# Patient Record
Sex: Female | Born: 2000 | Race: Black or African American | Hispanic: No | Marital: Single | Smoking: Current every day smoker
Health system: Southern US, Community
[De-identification: ages and names within clinical notes are randomized; demographics above are authoritative.]

## PROBLEM LIST (undated history)

## (undated) ENCOUNTER — Emergency Department: Source: Home / Self Care

## (undated) DIAGNOSIS — K859 Acute pancreatitis without necrosis or infection, unspecified: Secondary | ICD-10-CM

## (undated) DIAGNOSIS — E119 Type 2 diabetes mellitus without complications: Secondary | ICD-10-CM

## (undated) HISTORY — PX: OTHER SURGICAL HISTORY: SHX169

---
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
## 2019-05-07 ENCOUNTER — Other Ambulatory Visit: Payer: Self-pay

## 2019-05-07 ENCOUNTER — Encounter: Payer: Self-pay | Admitting: Advanced Practice Midwife

## 2019-05-07 ENCOUNTER — Ambulatory Visit: Payer: Medicaid Other | Admitting: Advanced Practice Midwife

## 2019-05-07 DIAGNOSIS — Z113 Encounter for screening for infections with a predominantly sexual mode of transmission: Secondary | ICD-10-CM | POA: Diagnosis not present

## 2019-05-07 DIAGNOSIS — E1043 Type 1 diabetes mellitus with diabetic autonomic (poly)neuropathy: Secondary | ICD-10-CM | POA: Insufficient documentation

## 2019-05-07 DIAGNOSIS — IMO0002 Reserved for concepts with insufficient information to code with codable children: Secondary | ICD-10-CM | POA: Insufficient documentation

## 2019-05-07 LAB — WET PREP FOR TRICH, YEAST, CLUE
Trichomonas Exam: NEGATIVE
Yeast Exam: NEGATIVE

## 2019-05-07 NOTE — Progress Notes (Signed)
Wet mount reviewed and is negative today, so no treatment needed for wet mount per standing order. Phone #'s provided to pt per provider order. Provider orders completed.Ronny Bacon, RN

## 2019-05-07 NOTE — Progress Notes (Signed)
    STI clinic/screening visit  Subjective:  Karen Johnson is a 18 y.o. female being seen today for an STI screening visit. The patient reports they do not have symptoms.  Patient has the following medical conditions:  There are no active problems to display for this patient.    No chief complaint on file.   HPI  Patient reports no symptoms.  Diabetic but not taking insulin or Metformin because can't get to [facility] where her doctor is  See flowsheet for further details and programmatic requirements.    The following portions of the patient's history were reviewed and updated as appropriate: allergies, current medications, past medical history, past social history, past surgical history and problem list.  Objective:  There were no vitals filed for this visit.  Physical Exam Vitals signs and nursing note reviewed.  Constitutional:      Appearance: Normal appearance.  HENT:     Head: Normocephalic and atraumatic.     Mouth/Throat:     Mouth: Mucous membranes are moist.     Pharynx: Oropharynx is clear. No oropharyngeal exudate or posterior oropharyngeal erythema.  Pulmonary:     Effort: Pulmonary effort is normal.  Abdominal:     General: Abdomen is flat.     Palpations: There is no mass.     Tenderness: There is no abdominal tenderness. There is no rebound.  Genitourinary:    General: Normal vulva.     Exam position: Lithotomy position.     Pubic Area: No rash or pubic lice.      Labia:        Right: No rash or lesion.        Left: No rash or lesion.      Vagina: Vaginal discharge (small amt white cottage cheese leukorrhea) and erythema (sl) present. No bleeding or lesions.     Cervix: No cervical motion tenderness, discharge, friability, lesion or erythema.     Uterus: Normal.      Adnexa: Right adnexa normal and left adnexa normal.     Rectum: Normal.  Lymphadenopathy:     Head:     Right side of head: No preauricular or posterior auricular adenopathy.     Left side  of head: No preauricular or posterior auricular adenopathy.     Cervical: No cervical adenopathy.     Upper Body:     Right upper body: No supraclavicular or axillary adenopathy.     Left upper body: No supraclavicular or axillary adenopathy.     Lower Body: No right inguinal adenopathy. No left inguinal adenopathy.  Skin:    General: Skin is warm and dry.     Findings: No rash.  Neurological:     Mental Status: She is alert and oriented to person, place, and time.       Assessment and Plan:  Karen Johnson is a 18 y.o. female presenting to the [facility] Department for STI screening  1. Screening examination for venereal disease Treat wet mount per standing orders Immunization nurse consult Please give [facility] # to pt - WET PREP FOR [place], YEAST, CLUE - Syphilis Serology, [place] [facility] - HIV [place] [facility] - Chlamydia/Gonorrhea [place] [facility]     No follow-ups on file.  No future appointments.  Herbie Saxon, CNM

## 2019-05-26 ENCOUNTER — Other Ambulatory Visit: Payer: Self-pay

## 2019-05-26 ENCOUNTER — Ambulatory Visit (LOCAL_COMMUNITY_HEALTH_CENTER): Payer: Medicaid Other

## 2019-05-26 VITALS — BP 120/73 | Ht 68.0 in | Wt 217.0 lb

## 2019-05-26 DIAGNOSIS — Z3201 Encounter for pregnancy test, result positive: Secondary | ICD-10-CM

## 2019-05-26 LAB — PREGNANCY, URINE: Preg Test, Ur: POSITIVE — AB

## 2019-05-26 NOTE — Progress Notes (Signed)
Pt unsure if she will continue with pregnancy, is unsure about any potential providers.

## 2019-05-27 ENCOUNTER — Emergency Department: Payer: BLUE CROSS/BLUE SHIELD

## 2019-05-27 ENCOUNTER — Emergency Department
Admission: EM | Admit: 2019-05-27 | Discharge: 2019-05-27 | Disposition: A | Discharge status: Home / Self Care | Payer: BLUE CROSS/BLUE SHIELD | Attending: Emergency Medicine | Admitting: Emergency Medicine

## 2019-05-27 ENCOUNTER — Other Ambulatory Visit: Payer: Self-pay

## 2019-05-27 DIAGNOSIS — Z3491 Encounter for supervision of normal pregnancy, unspecified, first trimester: Secondary | ICD-10-CM

## 2019-05-27 DIAGNOSIS — Z794 Long term (current) use of insulin: Secondary | ICD-10-CM | POA: Diagnosis not present

## 2019-05-27 DIAGNOSIS — O99891 Other specified diseases and conditions complicating pregnancy: Secondary | ICD-10-CM | POA: Diagnosis present

## 2019-05-27 DIAGNOSIS — N3000 Acute cystitis without hematuria: Secondary | ICD-10-CM | POA: Insufficient documentation

## 2019-05-27 DIAGNOSIS — Z3A01 Less than 8 weeks gestation of pregnancy: Secondary | ICD-10-CM | POA: Insufficient documentation

## 2019-05-27 DIAGNOSIS — E119 Type 2 diabetes mellitus without complications: Secondary | ICD-10-CM | POA: Insufficient documentation

## 2019-05-27 DIAGNOSIS — O24111 Pre-existing diabetes mellitus, type 2, in pregnancy, first trimester: Secondary | ICD-10-CM | POA: Diagnosis not present

## 2019-05-27 DIAGNOSIS — O2311 Infections of bladder in pregnancy, first trimester: Secondary | ICD-10-CM | POA: Diagnosis not present

## 2019-05-27 DIAGNOSIS — Z87891 Personal history of nicotine dependence: Secondary | ICD-10-CM | POA: Diagnosis not present

## 2019-05-27 DIAGNOSIS — R1032 Left lower quadrant pain: Secondary | ICD-10-CM | POA: Insufficient documentation

## 2019-05-27 LAB — URINALYSIS, COMPLETE (UACMP) WITH MICROSCOPIC
Bilirubin Urine: NEGATIVE
Glucose, UA: NEGATIVE mg/dL
Hgb urine dipstick: NEGATIVE
Ketones, ur: NEGATIVE mg/dL
Nitrite: POSITIVE — AB
Protein, ur: NEGATIVE mg/dL
Specific Gravity, Urine: 1.009 (ref 1.005–1.030)
pH: 6 (ref 5.0–8.0)

## 2019-05-27 LAB — CBC WITH DIFFERENTIAL/PLATELET
Abs Immature Granulocytes: 0.05 10*3/uL (ref 0.00–0.07)
Basophils Absolute: 0.1 10*3/uL (ref 0.0–0.1)
Basophils Relative: 0 %
Eosinophils Absolute: 0 10*3/uL (ref 0.0–0.5)
Eosinophils Relative: 0 %
HCT: 40.1 % (ref 36.0–46.0)
Hemoglobin: 14.7 g/dL (ref 12.0–15.0)
Immature Granulocytes: 0 %
Lymphocytes Relative: 17 %
Lymphs Abs: 2.7 10*3/uL (ref 0.7–4.0)
MCH: 28.1 pg (ref 26.0–34.0)
MCHC: 36.7 g/dL — ABNORMAL HIGH (ref 30.0–36.0)
MCV: 76.5 fL — ABNORMAL LOW (ref 80.0–100.0)
Monocytes Absolute: 0.8 10*3/uL (ref 0.1–1.0)
Monocytes Relative: 5 %
Neutro Abs: 12.1 10*3/uL — ABNORMAL HIGH (ref 1.7–7.7)
Neutrophils Relative %: 78 %
Platelets: 341 10*3/uL (ref 150–400)
RBC: 5.24 MIL/uL — ABNORMAL HIGH (ref 3.87–5.11)
RDW: 11.8 % (ref 11.5–15.5)
WBC: 15.7 10*3/uL — ABNORMAL HIGH (ref 4.0–10.5)
nRBC: 0 % (ref 0.0–0.2)

## 2019-05-27 LAB — COMPREHENSIVE METABOLIC PANEL
ALT: 9 U/L (ref 0–44)
AST: 14 U/L — ABNORMAL LOW (ref 15–41)
Albumin: 4.1 g/dL (ref 3.5–5.0)
Alkaline Phosphatase: 32 U/L — ABNORMAL LOW (ref 38–126)
Anion gap: 9 (ref 5–15)
BUN: <5 mg/dL — ABNORMAL LOW (ref 6–20)
CO2: 25 mmol/L (ref 22–32)
Calcium: 9.6 mg/dL (ref 8.9–10.3)
Chloride: 101 mmol/L (ref 98–111)
Creatinine, Ser: 0.61 mg/dL (ref 0.44–1.00)
GFR calc Af Amer: >60 mL/min (ref >60)
GFR calc non Af Amer: >60 mL/min (ref >60)
Glucose, Bld: 90 mg/dL (ref 70–99)
Potassium: 3.5 mmol/L (ref 3.5–5.1)
Sodium: 135 mmol/L (ref 135–145)
Total Bilirubin: 0.5 mg/dL (ref 0.3–1.2)
Total Protein: 7.6 g/dL (ref 6.5–8.1)

## 2019-05-27 LAB — POCT PREGNANCY, URINE: Preg Test, Ur: POSITIVE — AB

## 2019-05-27 LAB — HCG, QUANTITATIVE, PREGNANCY: hCG, Beta Chain, Quant, S: 576 m[IU]/mL — ABNORMAL HIGH (ref <5)

## 2019-05-27 MED ORDER — CEPHALEXIN 500 MG PO CAPS: 500.0000 mg | ORAL_CAPSULE | Freq: Two times a day (BID) | ORAL | 0 refills | Status: AC

## 2019-05-27 MED ORDER — PRENATAL VITAMINS 28-0.8 MG PO TABS: 1.0000 | ORAL_TABLET | Freq: Every day | ORAL | 2 refills | Status: DC

## 2019-05-27 NOTE — ED Provider Notes (Signed)
[facility] Emergency Department Provider Note  ____________________________________________  Time seen: Approximately 7:03 PM  The following is a medical screening exam note. It is intended that the patient await an ER room assignment for detailed exam, diagnosis, and disposition.  I have reviewed the triage vital signs.    HISTORY  Chief Complaint No chief complaint on file.   HPI Karen Johnson is a 18 y.o. female presents to the emergency department for treatment of left lower quadrant pain. Recently found out she is pregnant. G1P0. No vaginal bleeding or discharge. No dysuria.   Past Medical History:  Diagnosis Date  . Diabetes mellitus without complication (HCC)   . Pancreatitis   . Pancreatitis     Patient Active Problem List   Diagnosis Date Noted  . 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  obesity 220 lbs 05/07/2019    Past Surgical History:  Procedure Laterality Date  . denies surgical hx      Prior to Admission medications   Medication Sig Start Date End Date Taking? Authorizing Provider  famotidine (PEPCID) 20 MG tablet Take 1 tablet (20 mg total) by mouth 2 (two) times daily for 15 days. 09/17/17 10/02/17  Menshew, Charlesetta Ivory, PA-C  insulin aspart protamine - aspart (NOVOLOG MIX 70/30 FLEXPEN) (70-30) 100 UNIT/ML FlexPen Inject 65 Units into the skin 2 (two) times daily. 01/27/17   [provider]  metFORMIN (GLUCOPHAGE-XR) 500 MG 24 hr tablet Take 2,000 mg by mouth daily. 04/15/17   [provider]  polyethylene glycol powder (GLYCOLAX/MIRALAX) powder Take 17 g by mouth daily. Patient not taking: Reported on 05/26/2019 07/16/17   Minna Antis, MD  triamcinolone ointment (KENALOG) 0.1 % Apply 1 application topically 2 (two) times daily. Patient not taking: Reported on 05/26/2019 09/17/17   Menshew, Charlesetta Ivory, PA-C    Allergies Patient has no known allergies.  No  family history on file.  Social History Social History   Tobacco Use  . Smoking status: Former Smoker    Packs/day: 2.00    Types: Cigars  . Smokeless tobacco: Never Used  Substance Use Topics  . Alcohol use: No    Comment: 3x/wk mixed drink  . Drug use: No    Review of Systems Constitutional: Negative for fever. ENT: Negative for sore throat. Respiratory: Negative for cough Gastrointestinal: Positive for abdominal pain.  No nausea, no vomiting.  No diarrhea.  Musculoskeletal: Negataive for generalized body aches. Skin: Negative for rash/lesion/wound. Neurological: Negative for headaches, focal weakness or numbness.  ____________________________________________   PHYSICAL EXAM:  VITAL SIGNS:  Today's Vitals   05/27/19 1906  BP: (!) 142/82  Pulse: 100  Resp: 20  Temp: 99.9 F (37.7 C)  TempSrc: Oral  SpO2: 100%  Weight: 98.4 kg  Height: 5\' 8"  (1.727 m)  PainSc: 8    Body mass index is 32.99 kg/m.   Constitutional: Alert and oriented. No acute distress. Head: Atraumatic. Nose: No congestion/rhinnorhea. Mouth/Throat: Mucous membranes are moist. Neck: No stridor.  Cardiovascular: Good peripheral circulation. Respiratory: Normal respiratory effort. Musculoskeletal: No restriction Neurologic:  Normal speech and language. No gross focal neurologic deficits are appreciated. Speech is normal. No gait instability. Skin:  Skin is warm, dry and intact. No rash noted. Psychiatric: Mood and affect are normal. Speech and behavior are normal.  ____________________________________________   LABS (all labs ordered are listed, but only abnormal results are displayed)  Labs Reviewed - No data to display ____________________________________________  EKG  Not indicated. ____________________________________________   INITIAL CLINICAL IMPRESSION(S)   18 year old female G1P0. Will get labs and urine. She was encouraged to wait for ER room assignment. If any acute  changes, she is to notify staff.     Victorino Dike, FNP 05/27/19 2108    Delman Kitten, MD 05/27/19 2117

## 2019-05-27 NOTE — ED Triage Notes (Signed)
Pt went [facility] and got a positive pregnancy test and is here to know her gestation. Pt co left lower abd pain that started yesterday. Pt denies  Any vag bleeding or discharge no dysuria.

## 2019-05-27 NOTE — ED Notes (Signed)
ED Provider at bedside. 

## 2019-05-27 NOTE — ED Notes (Signed)
Pt called several times from lobby, outside and restroom with no answer

## 2019-05-27 NOTE — Discharge Instructions (Signed)
Please return the emergency department in 2 days for beta hCG recheck on blood work to confirm that there are not any complications with your pregnancy.  Please establish care with OB/GYN.  Begin prenatal vitamins.

## 2019-05-27 NOTE — ED Notes (Addendum)
See triage note. Pt states LLQ Sx began yesterday. Pain described as sharp and intermittent. Pain ongoing at time of assessment. Denies spotting, vaginal discharge, pain with urination. Denies fever/chills, NVD. Pt A&Ox4, NAD, no respiratory Sx evident. Pt states that this is her first pregnancy.

## 2019-05-27 NOTE — ED Notes (Signed)
Pt noted sitting in lobby, texting on phone with no distress noted; pt reports that she was in u/s earlier; pt updated on wait time & voices understanding

## 2019-05-28 NOTE — ED Provider Notes (Signed)
[facility] Emergency Department Provider Note  ____________________________________________  Time seen: Approximately 12:33 AM  I have reviewed the triage vital signs and the nursing notes.   HISTORY  Chief Complaint Abdominal Pain    HPI Karen Johnson is a 18 y.o. female that presents to the emergency department following a positive pregnancy test, requesting to know how far along her pregnancy is.  Patient states that she has also noticed some left lower quadrant pain yesterday and today.  She does not recall the date of her last menstrual cycle but estimates it was sometime in October.  No fevers, vaginal discharge, vaginal spotting, urinary symptoms.   Past Medical History:  Diagnosis Date  . Diabetes mellitus without complication ([place])   . Pancreatitis   . Pancreatitis     Patient Active Problem List   Diagnosis Date Noted  . 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  obesity 220 lbs 05/07/2019    Past Surgical History:  Procedure Laterality Date  . denies surgical hx      Prior to Admission medications   Medication Sig Start Date End Date Taking? Authorizing Provider  cephALEXin (KEFLEX) 500 MG capsule Take 1 capsule (500 mg total) by mouth 2 (two) times daily for 10 days. 05/27/19 06/06/19  Laban Emperor, PA-C  famotidine (PEPCID) 20 MG tablet Take 1 tablet (20 mg total) by mouth 2 (two) times daily for 15 days. 09/17/17 10/02/17  Menshew, Dannielle Karvonen, PA-C  insulin aspart protamine - aspart (NOVOLOG MIX 70/30 FLEXPEN) (70-30) 100 UNIT/ML FlexPen Inject 65 Units into the skin 2 (two) times daily. 01/27/17   [provider]  metFORMIN (GLUCOPHAGE-XR) 500 MG 24 hr tablet Take 2,000 mg by mouth daily. 04/15/17   [provider]  polyethylene glycol powder (GLYCOLAX/MIRALAX) powder Take 17 g by mouth daily. Patient not taking: Reported on 05/26/2019 07/16/17   Harvest Dark, MD   Prenatal Vit-Fe Fumarate-FA (PRENATAL VITAMINS) 28-0.8 MG TABS Take 1 tablet by mouth daily. 05/27/19   Laban Emperor, PA-C  triamcinolone ointment (KENALOG) 0.1 % Apply 1 application topically 2 (two) times daily. Patient not taking: Reported on 05/26/2019 09/17/17   Menshew, Dannielle Karvonen, PA-C    Allergies Patient has no known allergies.  No family history on file.  Social History Social History   Tobacco Use  . Smoking status: Former Smoker    Packs/day: 2.00    Types: Cigars  . Smokeless tobacco: Never Used  Substance Use Topics  . Alcohol use: No    Comment: 3x/wk mixed drink  . Drug use: No     Review of Systems  Cardiovascular: No chest pain. Respiratory: No SOB. Gastrointestinal: Positive for left lower quadrant pain.  No nausea, no vomiting.  Musculoskeletal: Negative for musculoskeletal pain. Skin: Negative for rash, abrasions, lacerations, ecchymosis. Neurological: Negative for headaches   ____________________________________________   PHYSICAL EXAM:  VITAL SIGNS: ED Triage Vitals [05/27/19 1906]  Enc Vitals Group     BP (!) 142/82     Pulse Rate 100     Resp 20     Temp 99.9 F (37.7 C)     Temp Source Oral     SpO2 100 %     Weight 217 lb (98.4 kg)     Height 5\' 8"  (1.727 m)     Head Circumference      Peak Flow      Pain Score 8     Pain Loc  Pain Edu?      Excl. in GC?      Constitutional: Alert and oriented. Well appearing and in no acute distress. Eyes: Conjunctivae are normal. PERRL. EOMI. Head: Atraumatic. ENT:      Ears:      Nose: No congestion/rhinnorhea.      Mouth/Throat: Mucous membranes are moist.  Neck: No stridor.   Cardiovascular: Normal rate, regular rhythm.  Good peripheral circulation. Respiratory: Normal respiratory effort without tachypnea or retractions. Lungs CTAB. Good air entry to the bases with no decreased or absent breath sounds. Gastrointestinal: Bowel sounds 4 quadrants. Soft and nontender to  palpation. No guarding or rigidity. No palpable masses. No distention.  Musculoskeletal: Full range of motion to all extremities. No gross deformities appreciated. Neurologic:  Normal speech and language. No gross focal neurologic deficits are appreciated.  Skin:  Skin is warm, dry and intact. No rash noted. Psychiatric: Mood and affect are normal. Speech and behavior are normal. Patient exhibits appropriate insight and judgement.   ____________________________________________   LABS (all labs ordered are listed, but only abnormal results are displayed)  Labs Reviewed  COMPREHENSIVE METABOLIC PANEL - Abnormal; Notable for the following components:      Result Value   BUN <5 (*)    AST 14 (*)    Alkaline Phosphatase 32 (*)    All other components within normal limits  CBC WITH DIFFERENTIAL/PLATELET - Abnormal; Notable for the following components:   WBC 15.7 (*)    RBC 5.24 (*)    MCV 76.5 (*)    MCHC 36.7 (*)    Neutro Abs 12.1 (*)    All other components within normal limits  URINALYSIS, COMPLETE (UACMP) WITH MICROSCOPIC - Abnormal; Notable for the following components:   Color, Urine YELLOW (*)    APPearance CLEAR (*)    Nitrite POSITIVE (*)    Leukocytes,Ua SMALL (*)    Bacteria, UA RARE (*)    All other components within normal limits  HCG, QUANTITATIVE, PREGNANCY - Abnormal; Notable for the following components:   hCG, Beta Chain, Quant, S 576 (*)    All other components within normal limits  POCT PREGNANCY, URINE - Abnormal; Notable for the following components:   Preg Test, Ur POSITIVE (*)    All other components within normal limits  POC URINE PREG, ED   ____________________________________________  EKG   ____________________________________________  RADIOLOGY Lexine BatonI, Dory Demont, personally viewed and evaluated these images (plain radiographs) as part of my medical decision making, as well as reviewing the written report by the radiologist.  [place] Ob Less Than 14  Weeks With Ob Transvaginal  Result Date: 05/27/2019 CLINICAL DATA:  Left upper quadrant cramping EXAM: OBSTETRIC <14 WK US AND TRANSVAGINAL OB US TECHNIQUE: Both transabdominal and transvaginal ultrasound examinations were performed for complete evaluation of the gestation as well as the maternal uterus, adnexal regions, and pelvic cul-de-sac. Transvaginal technique was performed to assess early pregnancy. COMPARISON:  None. FINDINGS: Intrauterine gestational sac: There possible 2 small intrauterine gestational sac seen. Yolk sac:  Not Visualized. Embryo:  Not Visualized. MSD: 2.8 mm   5 w   0 d, 2.1 mm 4 weeks 6 days Subchorionic hemorrhage:  None visualized. Maternal uterus/adnexae: Probable corpus luteum cyst seen within the right ovary. The left ovary is unremarkable. IMPRESSION: Probable two early intrauterine gestational sacs, but no yolk sac, fetal pole, or cardiac activity yet visualized. Recommend follow-up quantitative B-HCG levels and follow-up US in 14 days to assess viability. This recommendation  follows SRU consensus guidelines: Diagnostic Criteria for Nonviable Pregnancy Early in the First Trimester. Malva Limes Med 2013; 025:4270-62. Electronically Signed   By: Jonna Clark M.D.   On: 05/27/2019 20:11    ____________________________________________    PROCEDURES  Procedure(s) performed:    Procedures    Medications - No data to display   ____________________________________________   INITIAL IMPRESSION / ASSESSMENT AND PLAN / ED COURSE  Pertinent labs & imaging results that were available during my care of the patient were reviewed by me and considered in my medical decision making (see chart for details).  Review of the [place] CSRS was performed in accordance of the [facility] prior to dispensing any controlled drugs.     Patient presented to the emergency department for evaluation following positive pregnancy test.  Ultrasound shows probable to early intrauterine gestational  sacs but no yolk sac, fetal pole, cardiac activity set yet visualized.  Beta-hCG 576.  White blood cell count mildly elevated at 15.7, consistent with early pregnancy.  Urinalysis contributory for cystitis and patient was started on Keflex for infection.  Patient will return the emergency department in 48 hours for beta-hCG recheck.  Patient is to follow up with ED and ob as directed.  Patient was given a prescription for Keflex and prenatal vitamins.  Patient is given ED precautions to return to the ED for any worsening or new symptoms.   [place] D Hizer was evaluated in Emergency Department on 05/28/2019 for the symptoms described in the history of present illness. She was evaluated in the context of the global COVID-19 pandemic, which necessitated consideration that the patient might be at risk for infection with the SARS-CoV-2 virus that causes COVID-19. Institutional protocols and algorithms that pertain to the evaluation of patients at risk for COVID-19 are in a state of rapid change based on information released by regulatory bodies including the CDC and federal and state organizations. These policies and algorithms were followed during the patient's care in the ED.  ____________________________________________  FINAL CLINICAL IMPRESSION(S) / ED DIAGNOSES  Final diagnoses:  Left lower quadrant abdominal pain  First trimester pregnancy  Acute cystitis without hematuria      NEW MEDICATIONS STARTED DURING THIS VISIT:  ED Discharge Orders         Ordered    Prenatal Vit-Fe Fumarate-FA (PRENATAL VITAMINS) 28-0.8 MG TABS  Daily     05/27/19 2248    cephALEXin (KEFLEX) 500 MG capsule  2 times daily     05/27/19 2309              This chart was dictated using voice recognition software/Dragon. Despite best efforts to proofread, errors can occur which can change the meaning. Any change was purely unintentional.    Enid Derry, PA-C 05/28/19 3762    Dionne Bucy,  MD 05/30/19 [phone]

## 2019-07-07 ENCOUNTER — Emergency Department
Admission: EM | Admit: 2019-07-07 | Discharge: 2019-07-07 | Discharge status: Absent without leave | Payer: BLUE CROSS/BLUE SHIELD

## 2019-10-21 ENCOUNTER — Other Ambulatory Visit: Payer: Self-pay

## 2019-10-21 ENCOUNTER — Ambulatory Visit (LOCAL_COMMUNITY_HEALTH_CENTER): Payer: BC Managed Care – PPO | Admitting: Physician Assistant

## 2019-10-21 ENCOUNTER — Encounter: Payer: Self-pay | Admitting: Physician Assistant

## 2019-10-21 VITALS — BP 114/77 | Ht 69.0 in | Wt 212.2 lb

## 2019-10-21 DIAGNOSIS — Z309 Encounter for contraceptive management, unspecified: Secondary | ICD-10-CM | POA: Diagnosis not present

## 2019-10-21 LAB — WET PREP FOR TRICH, YEAST, CLUE
Trichomonas Exam: NEGATIVE
Yeast Exam: NEGATIVE

## 2019-10-21 LAB — PREGNANCY, URINE: Preg Test, Ur: NEGATIVE

## 2019-10-21 NOTE — Progress Notes (Signed)
Family Planning Visit- Repeat Yearly Visit  Subjective:  [place] D Kepple is a 19 y.o. being seen today for an well woman visit and to discuss family planning options and requesting STD screening.   She is currently using none for pregnancy prevention. Patient reports she does not want a pregnancy in the next year. 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  Chief Complaint  Patient presents with  . SEXUALLY TRANSMITTED DISEASE  . Contraception    Patient reports she had a medical abortion in Jan 2021, was rx OCPs but has not picked up from pharmacy. Seeing MD in [place] for her diabetes, last visit "4-5 months ago", and states she is taking insulin and metformin. Quit smoking in early 2021 and does not plan to restart. No known exposure to STIs. Had a period 09/14/19, then unprotected intercourse 09/27/19 and took Plan B. Had several days of vaginal bleeding beginning 10/02/19. Had a neg home preg test 10/10/19. Now having vaginal bleeding c/w menses since 10/19/19.  Patient denies pain, vaginal discharge, odor or itching. FH/personal hx neg for stroke, clotting disorder, uterine or ovarian CA.  See flowsheet for other program required questions.   Body mass index is 31.34 kg/m. - Patient is eligible for diabetes screening based on BMI and age >62?  not applicable ZH0Q ordered? not applicable  Patient reports 2 of partners in last year. Desires STI screening?  Yes  Does the patient have a current or past history of drug use? No   No components found for: HCV]   Health Maintenance Due  Topic Date Due  . HEMOGLOBIN A1C  Never done  . PNEUMOCOCCAL POLYSACCHARIDE VACCINE AGE 19-64 HIGH RISK  Never done  . FOOT EXAM  Never done  . OPHTHALMOLOGY EXAM  Never done  . URINE MICROALBUMIN  Never done  . CHLAMYDIA SCREENING  Never done  . HIV Screening  Never done  . COVID-19 Vaccine (1) Never done  .  TETANUS/TDAP  Never done    Review of Systems  Constitutional: Negative.   HENT: Negative.   Respiratory: Negative.   Cardiovascular: Negative.   Gastrointestinal: Negative.   Genitourinary: Negative.   Musculoskeletal: Negative.   Skin: Negative.   Neurological: Negative.   Endo/Heme/Allergies: Negative.   Psychiatric/Behavioral: Negative.     The following portions of the patient's history were reviewed and updated as appropriate: allergies, current medications, past family history, past medical history, past social history, past surgical history and problem list. Problem list updated.  Objective:   Vitals:   10/21/19 1412  BP: 114/77  Weight: 212 lb 3.2 oz (96.3 kg)  Height: 5\' 9"  (1.753 m)    Physical Exam Constitutional:      General: She is not in acute distress.    Appearance: She is obese. She is not toxic-appearing.  HENT:     Head: Normocephalic.  Pulmonary:     Effort: Pulmonary effort is normal.  Genitourinary:    Exam position: Lithotomy position.     Pubic Area: No rash or pubic lice.      Labia:        Right: No rash, tenderness or lesion.        Left: No rash, tenderness or lesion.      Urethra: No urethral pain, urethral swelling or urethral lesion.     Vagina: No foreign body. Bleeding present. No tenderness.     Cervix: Cervical bleeding  present. No cervical motion tenderness.     Uterus: Not tender.      Adnexa:        Right: No tenderness.         Left: No tenderness.       Rectum: No external hemorrhoid.     Comments: Vagina with mod amt of dark blood c/w menses. Vag pH not assessed due to presence of blood. Musculoskeletal:     Cervical back: Normal range of motion.  Lymphadenopathy:     Lower Body: No right inguinal adenopathy. No left inguinal adenopathy.  Skin:    General: Skin is warm and dry.  Neurological:     Mental Status: She is alert and oriented to person, place, and time.  Psychiatric:        Behavior: Behavior normal.         Thought Content: Thought content normal.        Judgment: Judgment normal.       Assessment and Plan:  [place] D Schwandt is a 19 y.o. female presenting to the [facility] Department for an initial well woman exam/family planning visit  Contraception counseling: Reviewed all forms of birth control options in the tiered based approach. available including abstinence; over the counter/barrier methods; hormonal contraceptive medication including pill, patch, ring, injection,contraceptive implant; hormonal and nonhormonal IUDs; permanent sterilization options including vasectomy and the various tubal sterilization modalities. Risks, benefits, and typical effectiveness rates were reviewed.  Questions were answered.  Written information was also given to the patient to review.  Patient desires OCPs, she was encouraged to obtain the [facility] Rx sent to her pharmacy within the last few months from another provider, and to start those pills within the next 3-4 days. She will follow up in  12 mo for surveillance.  She was told to call with any further questions, or with any concerns about this method of contraception.  Emphasized use of condoms 100% of the time for STI prevention.  1. Encounter for contraceptive management, unspecified type UPT and wet mount both negative. Enc pt to pick up OCP rx as sent to her pharmacy by outside provider. - Pregnancy, urine - WET PREP FOR TRICH, YEAST, CLUE - Chlamydia/Gonorrhea [facility]  Return in about 1 year (around 10/20/2020) for routine well-woman exam.  No future appointments.  Landry Dyke, PA-C

## 2019-10-21 NOTE — Progress Notes (Addendum)
[employer] results reviewed. Per standing orders no treatment indicated. Patient states she has an Rx for OCP's prescribed by another provider ready to be picked up from the pharmacy. Stressed importance of picking up and starting birth control pills. Tawny Hopping, RN

## 2019-10-21 NOTE — Progress Notes (Addendum)
Here today to discuss birth control. No record of PE's or Pap Smears here. States took Plan B 09/27/2019 and started having vaginal bleeding 10/02/2019, has since stopped but began having vaginal bleeding again yesterday, unsure if this is her normal period. Requests a PT and STD testing today. Declines bloodwork. Tawny Hopping, RN

## 2020-04-23 IMAGING — US US OB < 14 WEEKS - US OB TV
1 series · 14 of 28 positions shown · non-contrast
Comparison: None.

CLINICAL DATA: Left upper quadrant cramping

EXAM:
OBSTETRIC <14 WK US AND TRANSVAGINAL OB US
TECHNIQUE: Both transabdominal and transvaginal ultrasound examinations were
performed for complete evaluation of the gestation as well as the
maternal uterus, adnexal regions, and pelvic cul-de-sac.
Transvaginal technique was performed to assess early pregnancy.

[Series 1: us ob < 14 weeks - us ob tv · 14 of 30 slices shown]
[im 2/30]
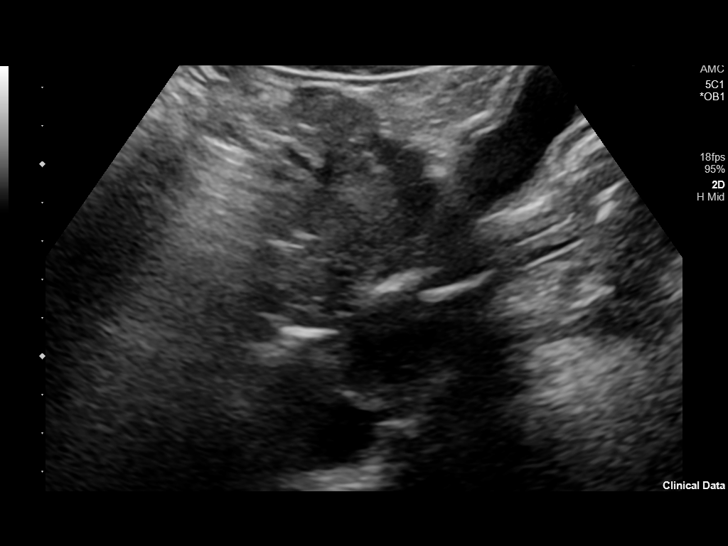
[im 4/30]
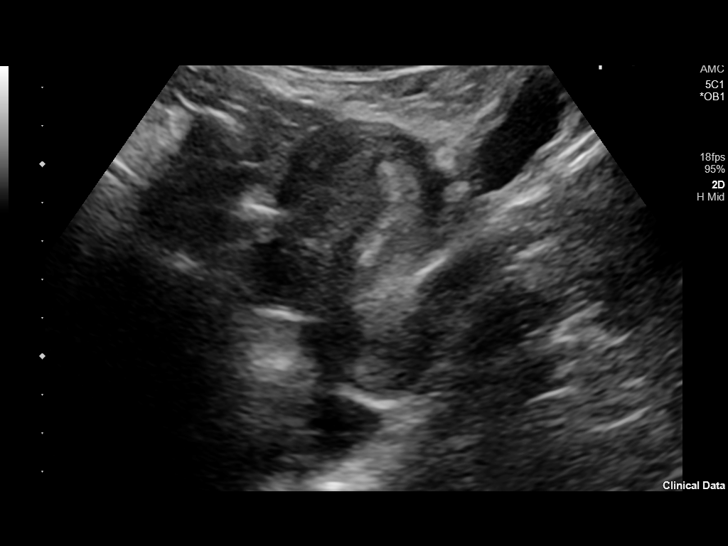
[im 6/30]
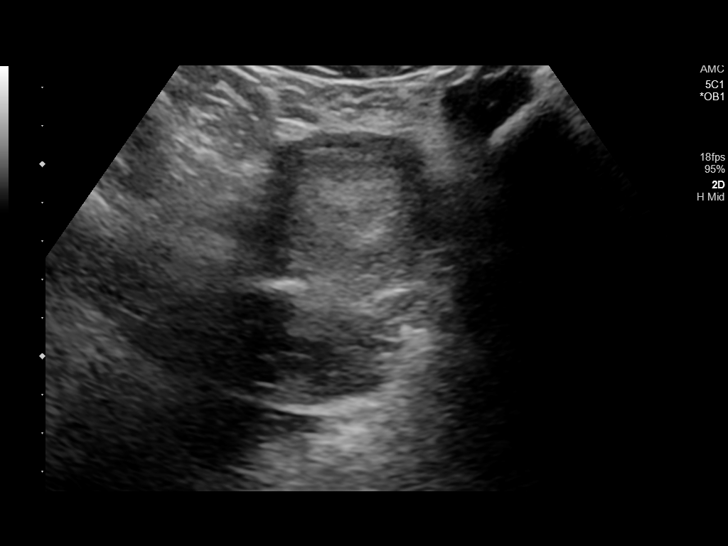
[im 8/30]
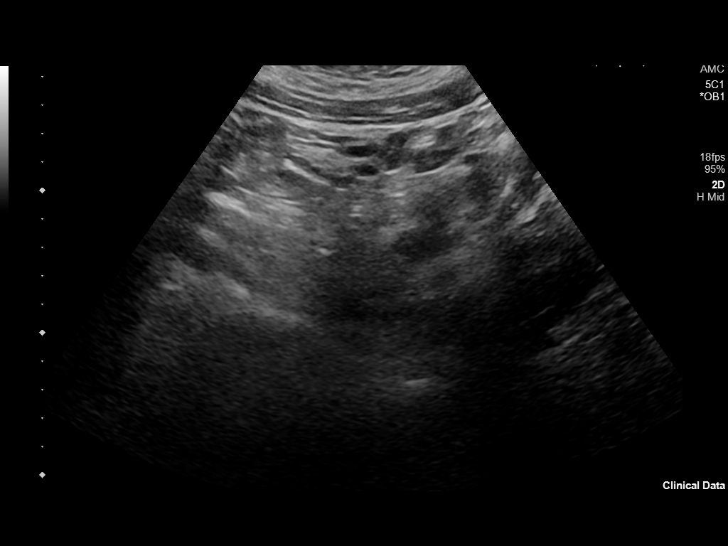
[im 10/30]
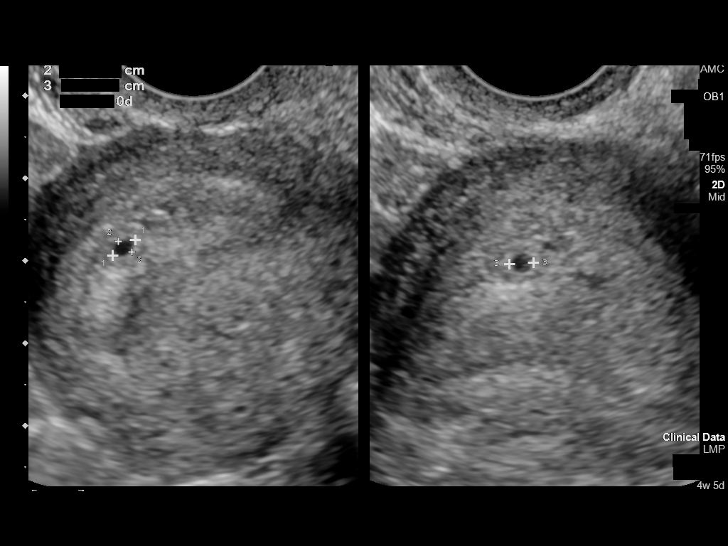
[im 12/30]
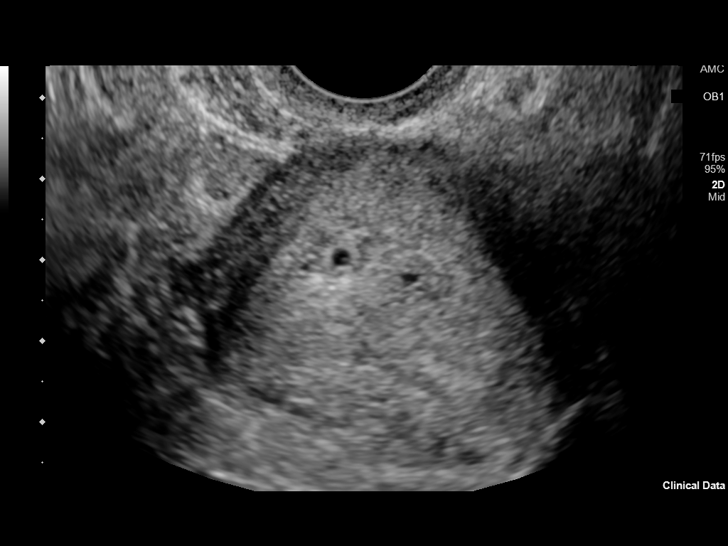
[im 14/30]
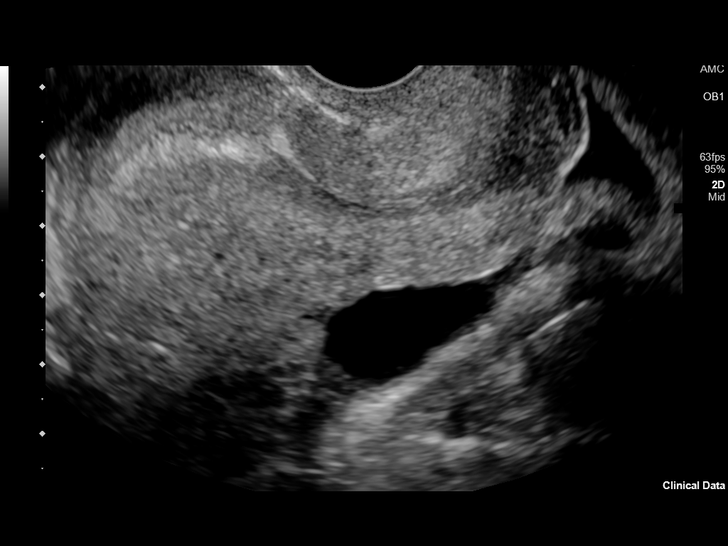
[im 17/30]
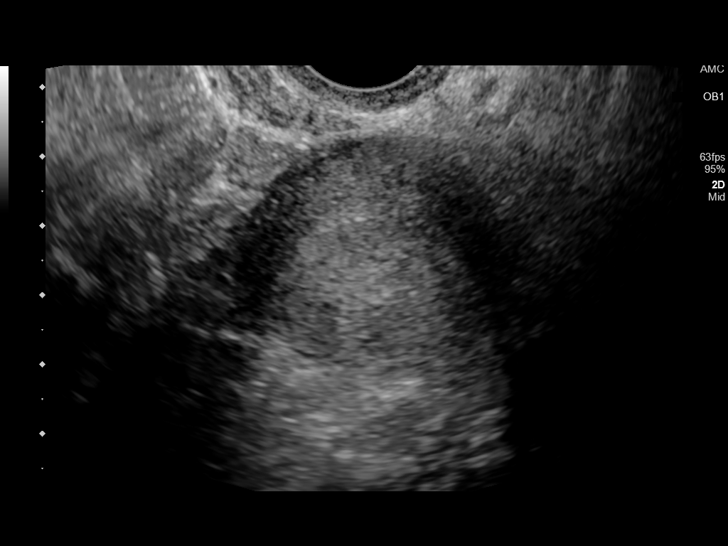
[im 19/30]
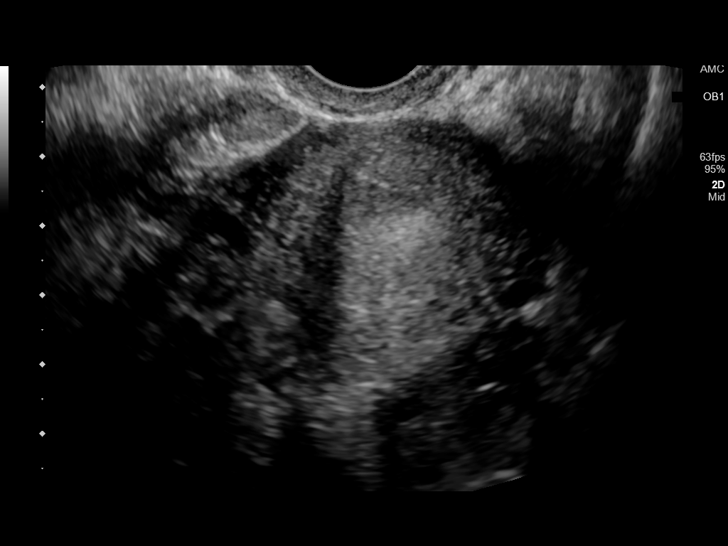
[im 21/30]
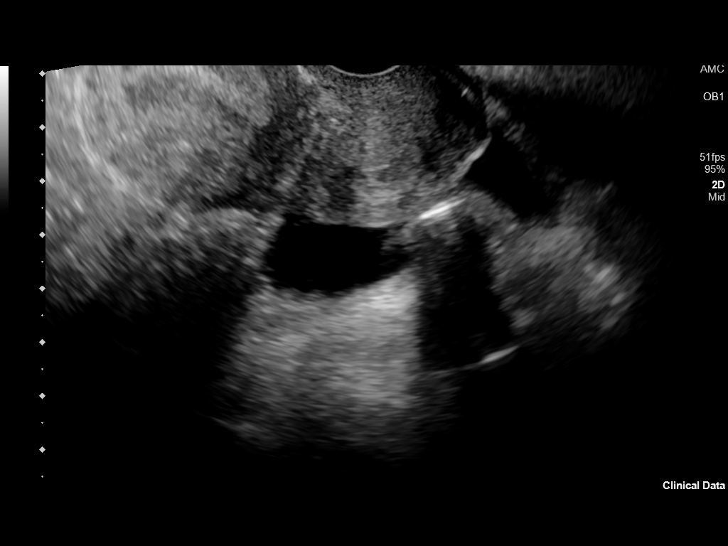
[im 23/30]
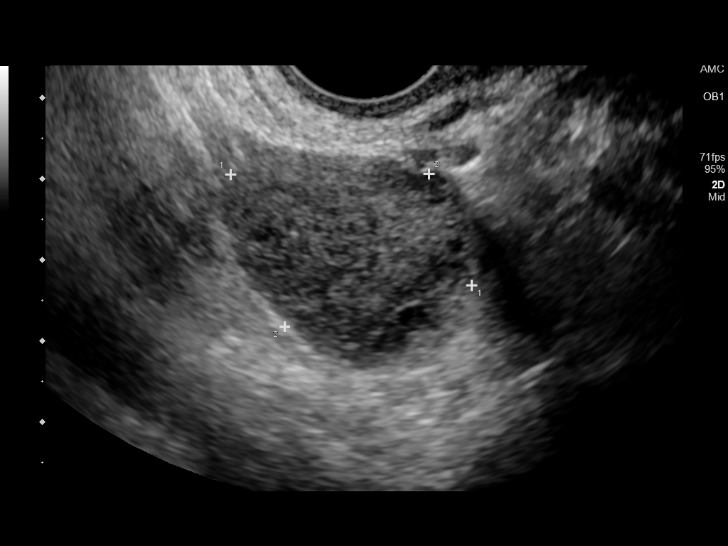
[im 25/30]
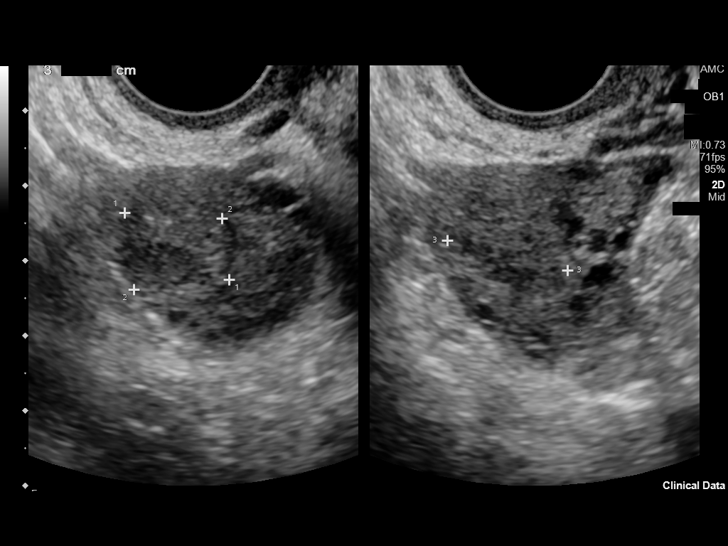
[im 27/30]
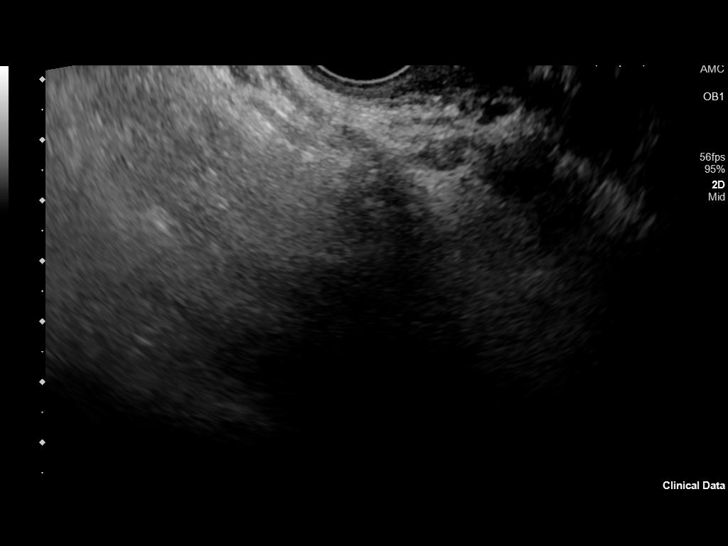
[im 30/30]
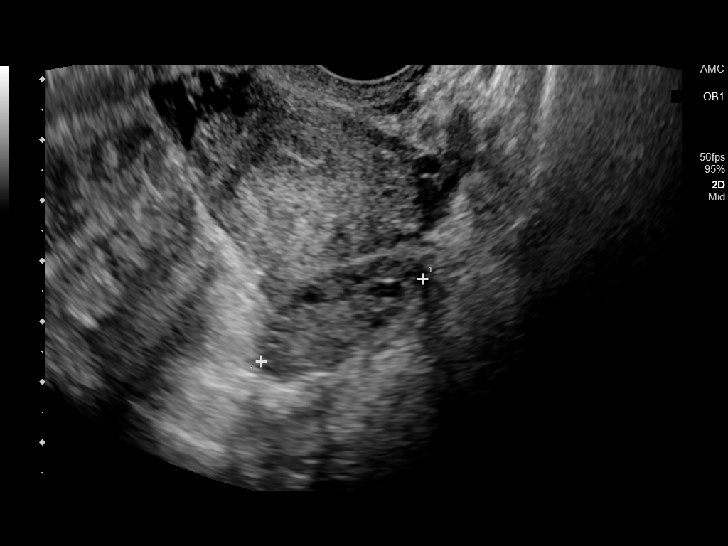

[14 of 28 positions shown; findings below may reference images not displayed]

FINDINGS: Intrauterine gestational sac: There possible 2 small intrauterine
gestational sac seen.

Yolk sac:  Not Visualized.

Embryo:  Not Visualized.

MSD: 2.8 mm   5 w   0 d, 2.1 mm 4 weeks 6 days

Subchorionic hemorrhage:  None visualized.

Maternal uterus/adnexae: Probable corpus luteum cyst seen within the
right ovary. The left ovary is unremarkable.
IMPRESSION: Probable two early intrauterine gestational sacs, but no yolk sac,
fetal pole, or cardiac activity yet visualized. Recommend follow-up
quantitative B-HCG levels and follow-up US in 14 days to assess
viability. This recommendation follows SRU consensus guidelines:
Diagnostic Criteria for Nonviable Pregnancy Early in the First
Trimester. N Engl J Med 5063; [DATE].

## 2020-08-01 ENCOUNTER — Ambulatory Visit: Payer: Self-pay | Admitting: Physician Assistant

## 2020-08-01 ENCOUNTER — Encounter: Payer: Self-pay | Admitting: Physician Assistant

## 2020-08-01 ENCOUNTER — Other Ambulatory Visit: Payer: Self-pay

## 2020-08-01 DIAGNOSIS — Z113 Encounter for screening for infections with a predominantly sexual mode of transmission: Secondary | ICD-10-CM

## 2020-08-01 DIAGNOSIS — Z7189 Other specified counseling: Secondary | ICD-10-CM

## 2020-08-01 LAB — WET PREP FOR TRICH, YEAST, CLUE
Trichomonas Exam: NEGATIVE
Yeast Exam: NEGATIVE

## 2020-08-01 LAB — HIV ANTIBODY (ROUTINE TESTING W REFLEX): HIV 1&2 Ab, 4th Generation: NONREACTIVE

## 2020-08-01 NOTE — Progress Notes (Signed)
Wet mount reviewed, no tx per provider orders. Provider orders completed. 

## 2020-08-01 NOTE — Progress Notes (Signed)
[facility] Department STI clinic/screening visit  Subjective:  Karen Johnson is a 20 y.o. female being seen today for an STI screening visit. The patient reports they do not have symptoms.  Patient reports that they do not desire a pregnancy in the next year.   They reported they are not interested in discussing contraception today.  Patient's last menstrual period was 07/21/2020.   Patient has the following medical conditions:   Patient Active Problem List   Diagnosis Date Noted  . 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  obesity 220 lbs 05/07/2019    Chief Complaint  Patient presents with  . SEXUALLY TRANSMITTED DISEASE    screening    HPI  Patient reports that she is not having any symptoms but would like a screening today.  Denies current medications, surgeries and hormonal BCM.  States that her last HIV test was in 2021 and she is not yet 20 so has not had a pap yet.     See flowsheet for further details and programmatic requirements.    The following portions of the patient's history were reviewed and updated as appropriate: allergies, current medications, past medical history, past social history, past surgical history and problem list.  Objective:  There were no vitals filed for this visit.  Physical Exam Constitutional:      General: She is not in acute distress.    Appearance: Normal appearance.  HENT:     Head: Normocephalic and atraumatic.     Comments: No nits,lice, or hair loss. No cervical, supraclavicular or axillary adenopathy.    Mouth/Throat:     Mouth: Mucous membranes are moist.     Pharynx: Oropharynx is clear. No oropharyngeal exudate or posterior oropharyngeal erythema.  Eyes:     Conjunctiva/sclera: Conjunctivae normal.  Pulmonary:     Effort: Pulmonary effort is normal.  Abdominal:     Palpations: Abdomen is soft. There is no mass.     Tenderness: There is no abdominal tenderness.  There is no guarding or rebound.  Genitourinary:    General: Normal vulva.     Rectum: Normal.     Comments: External genitalia/pubic area without nits, lice, edema, erythema, lesions and inguinal adenopathy. Vagina with normal mucosa and discharge. Cervix without visible lesions. Uterus firm, mobile, nt, no masses, no CMT, no adnexal tenderness or fullness. Musculoskeletal:     Cervical back: Neck supple. No tenderness.  Skin:    General: Skin is warm and dry.     Findings: No bruising, erythema, lesion or rash.  Neurological:     Mental Status: She is alert and oriented to person, place, and time.  Psychiatric:        Mood and Affect: Mood normal.        Behavior: Behavior normal.        Thought Content: Thought content normal.        Judgment: Judgment normal.      Assessment and Plan:  Karen Johnson is a 20 y.o. female presenting to the [facility] Department for STI screening  1. Screening for STD (sexually transmitted disease) Patient into clinic without symptoms. Rec condoms with all sex. Await test results.  Counseled that RN will call if needs to RTC for treatment once results are back. - WET PREP FOR TRICH, YEAST, CLUE - Chlamydia/Gonorrhea [facility] - HIV [facility] - Syphilis Serology, [facility]  2. Other specified counseling Counseled patient re:  sequelae of untreated/inadequately treated diabetes. Enc patient to follow up with PCP ASAP to get back into care.     No follow-ups on file.  No future appointments.  Matt Holmes, PA

## 2020-08-07 ENCOUNTER — Other Ambulatory Visit: Payer: Self-pay

## 2020-08-07 NOTE — Progress Notes (Signed)
RN abstracted HIV non reactive. Traivon Morrical, RN  

## 2020-11-30 ENCOUNTER — Encounter: Payer: Self-pay | Admitting: Family Medicine

## 2020-11-30 ENCOUNTER — Other Ambulatory Visit: Payer: Self-pay

## 2020-11-30 ENCOUNTER — Ambulatory Visit: Payer: BC Managed Care – PPO | Admitting: Family Medicine

## 2020-11-30 DIAGNOSIS — Z113 Encounter for screening for infections with a predominantly sexual mode of transmission: Secondary | ICD-10-CM

## 2020-11-30 LAB — WET PREP FOR TRICH, YEAST, CLUE
Trichomonas Exam: NEGATIVE
Yeast Exam: NEGATIVE

## 2020-11-30 NOTE — Progress Notes (Addendum)
[facility] Department STI clinic/screening visit  Subjective:  Karen Johnson is a 20 y.o. female being seen today for an STI screening visit. The patient reports they do not have symptoms.  Patient reports that they do not desire a pregnancy in the next year.   They reported they are not interested in discussing contraception today.  Patient's last menstrual period was 11/23/2020.   Patient has the following medical conditions:   Patient Active Problem List   Diagnosis Date Noted   diabetic without neuropathy, noncompliant with insulin and metformin 05/07/2019    obesity 220 lbs 05/07/2019    Chief Complaint  Patient presents with   SEXUALLY TRANSMITTED DISEASE    Screening and PT    HPI  Patient reports here for screening, pt requesting pregnancy test d/t period being late.    Last HIV test per patient/review of record was 02/82022 No previous pap d/t age   See flowsheet for further details and programmatic requirements.    The following portions of the patient's history were reviewed and updated as appropriate: allergies, current medications, past medical history, past social history, past surgical history and problem list.  Objective:  There were no vitals filed for this visit.  Physical Exam Vitals and nursing note reviewed.  Constitutional:      Appearance: Normal appearance.  HENT:     Head: Normocephalic and atraumatic.     Mouth/Throat:     Mouth: Mucous membranes are moist.     Pharynx: Oropharynx is clear. No oropharyngeal exudate or posterior oropharyngeal erythema.  Pulmonary:     Effort: Pulmonary effort is normal.  Chest:  Breasts:    Right: No axillary adenopathy or supraclavicular adenopathy.     Left: No axillary adenopathy or supraclavicular adenopathy.  Abdominal:     General: Abdomen is flat.     Palpations: There is no mass.     Tenderness: There is no abdominal tenderness. There is no rebound.  Genitourinary:    General:  Normal vulva.     Exam position: Lithotomy position.     Pubic Area: No rash or pubic lice.      Labia:        Right: No rash or lesion.        Left: No rash or lesion.      Vagina: Normal. No vaginal discharge, erythema, bleeding or lesions.     Cervix: No cervical motion tenderness, discharge, friability, lesion or erythema.     Uterus: Normal.      Adnexa: Right adnexa normal and left adnexa normal.     Rectum: Normal.     Comments: External genitalia without, lice, nits, erythema, edema , lesions or inguinal adenopathy. Vagina with normal mucosa and white discharge and pH equals 4.  Cervix without visual lesions, uterus firm, mobile, non-tender, no masses, CMT adnexal fullness or tenderness.   Musculoskeletal:     Cervical back: Normal range of motion and neck supple.  Lymphadenopathy:     Head:     Right side of head: No preauricular or posterior auricular adenopathy.     Left side of head: No preauricular or posterior auricular adenopathy.     Cervical: No cervical adenopathy.     Upper Body:     Right upper body: No supraclavicular or axillary adenopathy.     Left upper body: No supraclavicular or axillary adenopathy.     Lower Body: No right inguinal adenopathy. No left inguinal adenopathy.  Skin:  General: Skin is warm and dry.     Findings: No rash.  Neurological:     Mental Status: She is alert and oriented to person, place, and time.  Psychiatric:        Behavior: Behavior normal.    Assessment and Plan:  Karen Johnson is a 20 y.o. female presenting to the [facility] Department for STI screening  1. Screening examination for venereal disease  - Chlamydia/Gonorrhea [place] [facility] - HIV [place] [facility] - Syphilis Serology, [place] [facility] - WET PREP FOR TRICH, YEAST, CLUE  Patient accepted all screenings including wet prep, oral, vaginal CT/GC and bloodwork for HIV/RPR.  Patient meets criteria for HepB screening? No. Ordered? No - does not meet  criteria Patient meets criteria for HepC screening? No. Ordered? No - does not meet criteria   Wet prep results neg    No Treatment needed  Discussed with patient that with last sex and when period started that pregnancy test is not indicated at this visit.  Patient verbalized understanding.    Discussed time line for [facility] results and that patient will be called with positive results and encouraged patient to call if she had not heard in 2 weeks.  Counseled to return or seek care for continued or worsening symptoms Recommended condom use with all sex  Patient is currently using  no method   to prevent pregnancy.     Return for as needed.  No future appointments.  Wendi Snipes, FNP

## 2020-12-14 ENCOUNTER — Ambulatory Visit: Payer: BC Managed Care – PPO | Admitting: Advanced Practice Midwife

## 2020-12-14 ENCOUNTER — Encounter: Payer: Self-pay | Admitting: Advanced Practice Midwife

## 2020-12-14 ENCOUNTER — Other Ambulatory Visit: Payer: Self-pay

## 2020-12-14 DIAGNOSIS — B379 Candidiasis, unspecified: Secondary | ICD-10-CM

## 2020-12-14 DIAGNOSIS — Z113 Encounter for screening for infections with a predominantly sexual mode of transmission: Secondary | ICD-10-CM

## 2020-12-14 DIAGNOSIS — A549 Gonococcal infection, unspecified: Secondary | ICD-10-CM

## 2020-12-14 LAB — WET PREP FOR TRICH, YEAST, CLUE: Trichomonas Exam: NEGATIVE

## 2020-12-14 MED ORDER — CEFTRIAXONE SODIUM 500 MG IJ SOLR
500.0000 mg | Freq: Once | INTRAMUSCULAR | Status: AC
  Administered 2020-12-14: 500 mg via INTRAMUSCULAR

## 2020-12-14 MED ORDER — CLOTRIMAZOLE 1 % VA CREA: 1.0000 | TOPICAL_CREAM | Freq: Every day | VAGINAL | 0 refills | Status: AC

## 2020-12-14 NOTE — Addendum Note (Signed)
Addended by: Berdie Ogren on: 12/14/2020 05:17 PM   Modules accepted: Orders

## 2020-12-14 NOTE — Progress Notes (Signed)
[facility] Department STI clinic/screening visit  Subjective:  [place] D Glasper is a 20 y.o.SBF G1P0 exsmoker female being seen today for an STI screening visit. The patient reports they do not have symptoms.  Patient reports that they do not desire a pregnancy in the next year.   They reported they are not interested in discussing contraception today.  Patient's last menstrual period was 11/23/2020.   Patient has the following medical conditions:   Patient Active Problem List   Diagnosis Date Noted   diabetic without neuropathy, noncompliant with insulin and metformin 05/07/2019    obesity 220 lbs 05/07/2019    Chief Complaint  Patient presents with   SEXUALLY TRANSMITTED DISEASE    Screening.  Contact to Gonorrhea    HPI  Patient reports was told by partner yesterday that he has GC and was treated 12/12/20. Last sex 12/12/20 without condom; with current partner x 3 mo; 1 sex partner in last 3 mo. LMP 11/23/20. Last ETOH 12/09/20 (1 glass wine)2x/mo. Last cigar 2021.  Last HIV test per patient/review of record was 11/30/20 Patient reports last pap was n/a   See flowsheet for further details and programmatic requirements.    The following portions of the patient's history were reviewed and updated as appropriate: allergies, current medications, past medical history, past social history, past surgical history and problem list.  Objective:  There were no vitals filed for this visit.  Physical Exam Vitals and nursing note reviewed.  Constitutional:      Appearance: Normal appearance. She is obese.  HENT:     Head: Normocephalic and atraumatic.     Mouth/Throat:     Mouth: Mucous membranes are moist.     Pharynx: Oropharynx is clear. No oropharyngeal exudate or posterior oropharyngeal erythema.  Eyes:     Conjunctiva/sclera: Conjunctivae normal.  Neck:     Thyroid: No thyroid mass, thyromegaly or thyroid tenderness.  Pulmonary:     Effort: Pulmonary effort is normal.   Chest:  Breasts:    Right: No axillary adenopathy or supraclavicular adenopathy.     Left: No axillary adenopathy or supraclavicular adenopathy.  Abdominal:     Palpations: Abdomen is soft. There is no mass.     Tenderness: There is no abdominal tenderness. There is no rebound.     Comments: Soft without masses or tenderness  Genitourinary:    General: Normal vulva.     Exam position: Lithotomy position.     Pubic Area: No rash or pubic lice.      Labia:        Right: No rash or lesion.        Left: No rash or lesion.      Vagina: Vaginal discharge (white leukorrhea, ph <4.5) present. No erythema, bleeding or lesions.     Cervix: Normal.     Uterus: Normal.      Adnexa: Right adnexa normal and left adnexa normal.     Rectum: Normal.  Lymphadenopathy:     Head:     Right side of head: No preauricular or posterior auricular adenopathy.     Left side of head: No preauricular or posterior auricular adenopathy.     Cervical: No cervical adenopathy.     Upper Body:     Right upper body: No supraclavicular or axillary adenopathy.     Left upper body: No supraclavicular or axillary adenopathy.     Lower Body: No right inguinal adenopathy. No left inguinal adenopathy.  Skin:  General: Skin is warm and dry.     Findings: No rash.  Neurological:     Mental Status: She is alert and oriented to person, place, and time.     Assessment and Plan:  [place] D Sharps is a 20 y.o. female presenting to the [facility] Department for STI screening  1. Screening examination for venereal disease Treat as contact to [facility] per standing orders Pt states her partner was treated for [facility] on 12/12/20 and told her yesterday - WET PREP FOR TRICH, YEAST, CLUE - Chlamydia/Gonorrhea [place] [facility] - HIV [place] [facility] - Syphilis Serology, [place] [facility]     No follow-ups on file.  No future appointments.  Alberteen Spindle, CNM

## 2020-12-14 NOTE — Progress Notes (Signed)
Pt is a contact to Gonorrhea and is wanting to be treated.  Wet mount result reviewed and medication dispensed per Provider orders.  Ceftriaxone 500 mg given IM in Rt deltoid without any complications. Berdie Ogren, RN

## 2020-12-14 NOTE — Progress Notes (Signed)
Pt here for STD screening.   Berdie Ogren, RN

## 2020-12-19 ENCOUNTER — Telehealth: Payer: Self-pay

## 2020-12-19 NOTE — Telephone Encounter (Signed)
Informed patient of positive Gonorrhea results from 12/14/2020 visit.  Patient treated at 12/14/2020 visit.

## 2020-12-21 LAB — HM HIV SCREENING LAB: HM HIV Screening: NEGATIVE

## 2021-10-13 ENCOUNTER — Emergency Department
Admission: EM | Admit: 2021-10-13 | Discharge: 2021-10-13 | Discharge status: Against Medical Advice | Payer: Medicaid Other | Attending: Emergency Medicine | Admitting: Emergency Medicine

## 2021-10-13 ENCOUNTER — Encounter: Payer: Self-pay | Admitting: Emergency Medicine

## 2021-10-13 ENCOUNTER — Other Ambulatory Visit: Payer: Self-pay

## 2021-10-13 DIAGNOSIS — Z5321 Procedure and treatment not carried out due to patient leaving prior to being seen by health care provider: Secondary | ICD-10-CM | POA: Insufficient documentation

## 2021-10-13 DIAGNOSIS — R197 Diarrhea, unspecified: Secondary | ICD-10-CM | POA: Diagnosis not present

## 2021-10-13 DIAGNOSIS — R112 Nausea with vomiting, unspecified: Secondary | ICD-10-CM | POA: Insufficient documentation

## 2021-10-13 DIAGNOSIS — K0889 Other specified disorders of teeth and supporting structures: Secondary | ICD-10-CM | POA: Insufficient documentation

## 2021-10-13 NOTE — ED Triage Notes (Signed)
Pt via POV from home. Pt c/o R upper mouth pain that started couple of weeks ago. Denies any swelling. States that she set up a dentist appt but they cannot see her till June. Pt is A&OX4 and NAD.  ?

## 2021-10-13 NOTE — ED Notes (Signed)
This nurse arrived on shift and went to patient's room to reassess her. Patient  was not in the room and unknown as to her location. Provider states she may have left because they never evaluated her. ?

## 2021-10-29 ENCOUNTER — Ambulatory Visit: Payer: BC Managed Care – PPO

## 2021-11-10 ENCOUNTER — Other Ambulatory Visit: Payer: Self-pay

## 2021-11-10 ENCOUNTER — Emergency Department: Payer: Medicaid Other

## 2021-11-10 ENCOUNTER — Emergency Department
Admission: EM | Admit: 2021-11-10 | Discharge: 2021-11-10 | Disposition: A | Discharge status: Home / Self Care | Payer: Medicaid Other | Attending: Emergency Medicine | Admitting: Emergency Medicine

## 2021-11-10 DIAGNOSIS — E119 Type 2 diabetes mellitus without complications: Secondary | ICD-10-CM | POA: Diagnosis not present

## 2021-11-10 DIAGNOSIS — R102 Pelvic and perineal pain: Secondary | ICD-10-CM | POA: Diagnosis not present

## 2021-11-10 DIAGNOSIS — N83292 Other ovarian cyst, left side: Secondary | ICD-10-CM | POA: Diagnosis not present

## 2021-11-10 DIAGNOSIS — N83202 Unspecified ovarian cyst, left side: Secondary | ICD-10-CM | POA: Diagnosis not present

## 2021-11-10 DIAGNOSIS — R1030 Lower abdominal pain, unspecified: Secondary | ICD-10-CM | POA: Diagnosis present

## 2021-11-10 DIAGNOSIS — N83209 Unspecified ovarian cyst, unspecified side: Secondary | ICD-10-CM

## 2021-11-10 LAB — CBC
HCT: 39.6 % (ref 36.0–46.0)
Hemoglobin: 13.7 g/dL (ref 12.0–15.0)
MCH: 28.3 pg (ref 26.0–34.0)
MCHC: 34.6 g/dL (ref 30.0–36.0)
MCV: 81.8 fL (ref 80.0–100.0)
Platelets: 272 10*3/uL (ref 150–400)
RBC: 4.84 MIL/uL (ref 3.87–5.11)
RDW: 12.1 % (ref 11.5–15.5)
WBC: 9.6 10*3/uL (ref 4.0–10.5)
nRBC: 0 % (ref 0.0–0.2)

## 2021-11-10 LAB — CHLAMYDIA/NGC RT PCR (ARMC ONLY)
Chlamydia Tr: NOT DETECTED
N gonorrhoeae: NOT DETECTED

## 2021-11-10 LAB — COMPREHENSIVE METABOLIC PANEL
ALT: 19 U/L (ref 0–44)
AST: 15 U/L (ref 15–41)
Albumin: 4.3 g/dL (ref 3.5–5.0)
Alkaline Phosphatase: 44 U/L (ref 38–126)
Anion gap: 7 (ref 5–15)
BUN: 10 mg/dL (ref 6–20)
CO2: 29 mmol/L (ref 22–32)
Calcium: 9.1 mg/dL (ref 8.9–10.3)
Chloride: 98 mmol/L (ref 98–111)
Creatinine, Ser: 0.65 mg/dL (ref 0.44–1.00)
GFR, Estimated: >60 mL/min (ref >60)
Glucose, Bld: 296 mg/dL — ABNORMAL HIGH (ref 70–99)
Potassium: 3.6 mmol/L (ref 3.5–5.1)
Sodium: 134 mmol/L — ABNORMAL LOW (ref 135–145)
Total Bilirubin: 0.7 mg/dL (ref 0.3–1.2)
Total Protein: 7.9 g/dL (ref 6.5–8.1)

## 2021-11-10 LAB — URINALYSIS, ROUTINE W REFLEX MICROSCOPIC
Bilirubin Urine: NEGATIVE
Glucose, UA: >=500 mg/dL — AB
Hgb urine dipstick: NEGATIVE
Ketones, ur: 20 mg/dL — AB
Nitrite: NEGATIVE
Protein, ur: NEGATIVE mg/dL
Specific Gravity, Urine: 1.041 — ABNORMAL HIGH (ref 1.005–1.030)
pH: 5 (ref 5.0–8.0)

## 2021-11-10 LAB — WET PREP, GENITAL
Clue Cells Wet Prep HPF POC: NONE SEEN
Sperm: NONE SEEN
Trich, Wet Prep: NONE SEEN
WBC, Wet Prep HPF POC: <10 (ref <10)
Yeast Wet Prep HPF POC: NONE SEEN

## 2021-11-10 LAB — LIPASE, BLOOD: Lipase: 27 U/L (ref 11–51)

## 2021-11-10 LAB — POC URINE PREG, ED: Preg Test, Ur: NEGATIVE

## 2021-11-10 MED ORDER — IBUPROFEN 800 MG PO TABS
800.0000 mg | ORAL_TABLET | ORAL | Status: AC
  Administered 2021-11-10: 800 mg via ORAL
  Filled 2021-11-10: qty 1

## 2021-11-10 MED ORDER — METFORMIN HCL 500 MG PO TABS
500.0000 mg | ORAL_TABLET | Freq: Once | ORAL | Status: AC
  Administered 2021-11-10: 500 mg via ORAL
  Filled 2021-11-10: qty 1

## 2021-11-10 NOTE — ED Triage Notes (Signed)
Pt states she has been having lower abd pain x2 days and it got worse today- pt denies n/v/d- pt denies urinary symptoms

## 2021-11-10 NOTE — Discharge Instructions (Signed)
? ?  Please return to the emergency room right away if you are to develop a fever, severe nausea, your pain becomes severe or worsens, you are unable to keep food down, begin vomiting any dark or bloody fluid, you develop any dark or bloody stools, feel dehydrated, or other new concerns or symptoms arise. ? ?

## 2021-11-10 NOTE — ED Provider Notes (Signed)
[facility] Provider Note    Event Date/Time   First MD Initiated Contact with Patient 11/10/21 [phone]     (approximate)   History   Abdominal Pain   HPI  Karen Johnson is a 21 y.o. female history of diabetes   For about 3 days now has been experiencing a achy and sometimes slightly sharp pain in her lower abdomen, she reports its right over the bottom of her middle abdomen, points towards her pelvis, a bit on the left lower.  Denies sexual activity for over a month.  No vaginal discharge or bleeding.  No fevers.  Reports the pain comes and goes feels like a sharp discomfort more present when she was walking around at work, just finished a 12-hour shift during the overnight  Denies pregnancy.  Reports last menstrual cycle was about May 3.  No vaginal discharge or bleeding.     Physical Exam   Triage Vital Signs: ED Triage Vitals  Enc Vitals Group     BP 11/10/21 0824 118/74     Pulse Rate 11/10/21 0823 85     Resp 11/10/21 0823 18     Temp 11/10/21 0823 99.1 F (37.3 C)     Temp Source 11/10/21 0823 Oral     SpO2 11/10/21 0823 100 %     Weight 11/10/21 0824 215 lb (97.5 kg)     Height 11/10/21 0824 5\' 5"  (1.651 m)     Head Circumference --      Peak Flow --      Pain Score 11/10/21 0824 5     Pain Loc --      Pain Edu? --      Excl. in GC? --     Most recent vital signs: Vitals:   11/10/21 0824 11/10/21 1140  BP: 118/74 116/81  Pulse:  88  Resp:  18  Temp:  98 F (36.7 C)  SpO2:  98%     General: Awake, no distress.  CV:  Good peripheral perfusion.  Resp:  Normal effort.  Abd:  No distention.  Soft nontender nondistended throughout the upper abdomen and right upper quadrant.  Negative Murphy.  No focal pain in McBurney's point.  Reports a mild tenderness to palpation of the suprapubic region and guarding.  Mild discomfort in the left lower quadrant slightly achy to sharp Other:    Patient declined pelvic exam.  Discussed with  her that would like to perform exam to check for evidence of infection because of pain in her lower abdomen/pelvis, patient declined this.  She reports she would be willing to self swab and provide a dirty urine sample.  Some limitation exam as patient declines pelvic exam   ED Results / Procedures / Treatments   Labs (all labs ordered are listed, but only abnormal results are displayed) Labs Reviewed  COMPREHENSIVE METABOLIC PANEL - Abnormal; Notable for the following components:      Result Value   Sodium 134 (*)    Glucose, Bld 296 (*)    All other components within normal limits  URINALYSIS, ROUTINE W REFLEX MICROSCOPIC - Abnormal; Notable for the following components:   Color, Urine YELLOW (*)    APPearance HAZY (*)    Specific Gravity, Urine 1.041 (*)    Glucose, UA >=500 (*)    Ketones, ur 20 (*)    Leukocytes,Ua TRACE (*)    Bacteria, UA RARE (*)    All other components within normal limits  WET PREP, GENITAL  CHLAMYDIA/NGC RT PCR ([facility] ONLY)            URINE CULTURE  LIPASE, BLOOD  CBC  POC URINE PREG, ED     EKG     RADIOLOGY  US PELVIC COMPLETE W TRANSVAGINAL AND TORSION R/O  Result Date: 11/10/2021 CLINICAL DATA:  Pelvic pain EXAM: TRANSABDOMINAL AND TRANSVAGINAL ULTRASOUND OF PELVIS DOPPLER ULTRASOUND OF OVARIES TECHNIQUE: Both transabdominal and transvaginal ultrasound examinations of the pelvis were performed. Transabdominal technique was performed for global imaging of the pelvis including uterus, ovaries, adnexal regions, and pelvic cul-de-sac. It was necessary to proceed with endovaginal exam following the transabdominal exam to visualize the endometrium and ovaries. Color and duplex Doppler ultrasound was utilized to evaluate blood flow to the ovaries. COMPARISON:  None Available. FINDINGS: Uterus Measurements: 8 x 3.6 x 4.8 cm = volume: 72 mL. No fibroids or other mass visualized. Endometrium Thickness: 11 mm. No focal abnormality is seen. Prominence of  endometrial stripe may be due to secretory phase of menstrual cycle. Right ovary Measurements: 3.7 x 2.1 x 2 cm = volume: 8 mL. Normal appearance/no adnexal mass. Left ovary Measurements: 4.7 x 2.2 x 2 cm = volume: 11 mL. There is 2 x 1.5 cm hypoechoic structure in the left adnexa, possibly hemorrhagic cyst/follicle. Pulsed Doppler evaluation of both ovaries demonstrates normal low-resistance arterial and venous waveforms. Other findings There is small to moderate amount of free fluid in pelvis. IMPRESSION: Small to moderate amount of free fluid in the cul-de-sac may suggest recent rupture of ovarian cyst or follicle. There is 2 cm hypoechoic structure in the left adnexa suggesting hemorrhagic follicle/cyst. There is no evidence of ovarian torsion.  Uterus is unremarkable. Electronically Signed   By: Elmer Picker M.D.   On: 11/10/2021 11:48     Imaging results reviewed, small amount to moderate amount of free fluid cul-de-sac.  Some suggestion of possible ruptured ovarian cyst   PROCEDURES:  Critical Care performed: No  Procedures   MEDICATIONS ORDERED IN ED: Medications  ibuprofen (ADVIL) tablet 800 mg (800 mg Oral Given 11/10/21 0947)  metFORMIN (GLUCOPHAGE) tablet 500 mg (500 mg Oral Given 11/10/21 1022)     IMPRESSION / MDM / ASSESSMENT AND PLAN / ED COURSE  I reviewed the triage vital signs and the nursing notes.                              Differential diagnosis includes, but is not limited to, pelvic pain, evaluate for pelvic etiologies of pain, pain seems to be very focally in the suprapubic to left lower quadrant described as discomfort and aching intermittently for 2 to 3 days.  Clinical history does not seem suggestive of acute appendicitis, perforation, catastrophe or show evidence of peritonitis or obvious infectious symptoms.  She does have a history of treatment for gonorrhea in the past on review of medical records, but she does not have any vaginal discharge at this  time per her report.  She did decline pelvic exam  We will check transvaginal ultrasound, urine and wet prep, and evaluate further for cause.  At this juncture very low pretest probability of acute intra-abdominal infection, and I feel that the benefits of CT would be outweighed by the risks of contrast and radiation at this point given the pretest probability for acute intra-abdominal infection abscess appendicitis diverticulitis cholecystitis nephrolithiasis etc.  Regarding diabetes and hyperglycemia, patient reports she takes metformin, but  has not taken it today as she just worked a 12-hour shift.  She is asymptomatic with regard to symptoms of hyperglycemia or diabetes at this time.   ----------------------------------------- 12:12 PM on 11/10/2021 ----------------------------------------- Patient reports pain is much better, feels well after ibuprofen.  Discussed results and my impression that pain is likely from ruptured ovarian cyst.  She is hemodynamically stable.  Pending at this time is urine culture as well as gonorrhea chlamydia.  Patient will discharge, comfortable with careful return precautions, and advises that she should probably establish an OB/GYN as well and I have placed a referral for [facility] to [facility]    FINAL CLINICAL IMPRESSION(S) / ED DIAGNOSES   Final diagnoses:  Rupture of ovarian cyst     Rx / DC Orders   ED Discharge Orders          Ordered    Ambulatory referral to Obstetrics / Gynecology       Comments: ED follow-up. Ovarian cyst and desire to establish a primary OBGYN   11/10/21 1207             Note:  This document was prepared using Dragon voice recognition software and may include unintentional dictation errors.   Delman Kitten, MD 11/10/21 1213

## 2021-11-11 NOTE — ED Provider Notes (Signed)
Clinical Course as of 11/11/21 5397  Sat Nov 10, 2021  1131 Urinalysis reviewed, may potentially be indicative of UTI.  Sample appears to be clean but noted is trace bacteria leukocytes.  Will send for culture.  The patient does not have any frank dysuria at this time.  If remainder of her work-up is negative, would consider empiric treatment for UTI [MQ]    Clinical Course User Index [MQ] Sharyn Creamer, MD      Sharyn Creamer, MD 11/11/21 [phone]

## 2021-11-12 LAB — URINE CULTURE: Culture: >=100000 — AB

## 2021-12-17 ENCOUNTER — Ambulatory Visit (INDEPENDENT_AMBULATORY_CARE_PROVIDER_SITE_OTHER): Payer: BC Managed Care – PPO | Admitting: Obstetrics & Gynecology

## 2021-12-17 ENCOUNTER — Encounter: Payer: Self-pay | Admitting: Obstetrics & Gynecology

## 2021-12-17 VITALS — BP 120/80 | Ht 69.0 in | Wt 204.0 lb

## 2021-12-17 DIAGNOSIS — N83202 Unspecified ovarian cyst, left side: Secondary | ICD-10-CM

## 2021-12-17 DIAGNOSIS — Z0000 Encounter for general adult medical examination without abnormal findings: Secondary | ICD-10-CM

## 2022-01-08 ENCOUNTER — Ambulatory Visit: Payer: BC Managed Care – PPO | Admitting: Obstetrics & Gynecology

## 2022-03-01 ENCOUNTER — Other Ambulatory Visit: Payer: Self-pay

## 2022-03-01 ENCOUNTER — Emergency Department
Admission: EM | Admit: 2022-03-01 | Discharge: 2022-03-01 | Disposition: A | Discharge status: Home / Self Care | Payer: Medicaid Other | Attending: Emergency Medicine | Admitting: Emergency Medicine

## 2022-03-01 DIAGNOSIS — R1032 Left lower quadrant pain: Secondary | ICD-10-CM | POA: Diagnosis not present

## 2022-03-01 DIAGNOSIS — Z5321 Procedure and treatment not carried out due to patient leaving prior to being seen by health care provider: Secondary | ICD-10-CM | POA: Diagnosis not present

## 2022-03-01 LAB — COMPREHENSIVE METABOLIC PANEL
ALT: 16 U/L (ref 0–44)
AST: 17 U/L (ref 15–41)
Albumin: 4.5 g/dL (ref 3.5–5.0)
Alkaline Phosphatase: 45 U/L (ref 38–126)
Anion gap: 9 (ref 5–15)
BUN: 8 mg/dL (ref 6–20)
CO2: 27 mmol/L (ref 22–32)
Calcium: 9.6 mg/dL (ref 8.9–10.3)
Chloride: 97 mmol/L — ABNORMAL LOW (ref 98–111)
Creatinine, Ser: 0.8 mg/dL (ref 0.44–1.00)
GFR, Estimated: >60 mL/min (ref >60)
Glucose, Bld: 450 mg/dL — ABNORMAL HIGH (ref 70–99)
Potassium: 4.2 mmol/L (ref 3.5–5.1)
Sodium: 133 mmol/L — ABNORMAL LOW (ref 135–145)
Total Bilirubin: 0.2 mg/dL — ABNORMAL LOW (ref 0.3–1.2)
Total Protein: 7.8 g/dL (ref 6.5–8.1)

## 2022-03-01 LAB — CBC
HCT: 39.9 % (ref 36.0–46.0)
Hemoglobin: 13.9 g/dL (ref 12.0–15.0)
MCH: 28.2 pg (ref 26.0–34.0)
MCHC: 34.8 g/dL (ref 30.0–36.0)
MCV: 80.9 fL (ref 80.0–100.0)
Platelets: 281 10*3/uL (ref 150–400)
RBC: 4.93 MIL/uL (ref 3.87–5.11)
RDW: 11.9 % (ref 11.5–15.5)
WBC: 10.6 10*3/uL — ABNORMAL HIGH (ref 4.0–10.5)
nRBC: 0 % (ref 0.0–0.2)

## 2022-03-01 LAB — URINALYSIS, ROUTINE W REFLEX MICROSCOPIC
Bilirubin Urine: NEGATIVE
Glucose, UA: >=500 mg/dL — AB
Hgb urine dipstick: NEGATIVE
Ketones, ur: NEGATIVE mg/dL
Leukocytes,Ua: NEGATIVE
Nitrite: NEGATIVE
Protein, ur: NEGATIVE mg/dL
Specific Gravity, Urine: 1.032 — ABNORMAL HIGH (ref 1.005–1.030)
Squamous Epithelial / HPF: NONE SEEN (ref 0–5)
pH: 6 (ref 5.0–8.0)

## 2022-03-01 LAB — POC URINE PREG, ED: Preg Test, Ur: NEGATIVE

## 2022-03-01 LAB — LIPASE, BLOOD: Lipase: 32 U/L (ref 11–51)

## 2022-03-01 NOTE — ED Notes (Signed)
Called pt several times no answer  

## 2022-03-01 NOTE — ED Triage Notes (Signed)
Pt comes with c/o left lower belly pain. Pt states this started 3 days ago. Pt states no N/V. Pt denies any chance of pregnancy.

## 2022-03-02 ENCOUNTER — Emergency Department
Admission: EM | Admit: 2022-03-02 | Discharge: 2022-03-02 | Disposition: A | Discharge status: Home / Self Care | Payer: Medicaid Other | Attending: Emergency Medicine | Admitting: Emergency Medicine

## 2022-03-02 ENCOUNTER — Emergency Department: Payer: Medicaid Other

## 2022-03-02 ENCOUNTER — Other Ambulatory Visit: Payer: Self-pay

## 2022-03-02 ENCOUNTER — Encounter: Payer: Self-pay | Admitting: Emergency Medicine

## 2022-03-02 DIAGNOSIS — R102 Pelvic and perineal pain: Secondary | ICD-10-CM | POA: Diagnosis not present

## 2022-03-02 DIAGNOSIS — R1032 Left lower quadrant pain: Secondary | ICD-10-CM

## 2022-03-02 DIAGNOSIS — E1165 Type 2 diabetes mellitus with hyperglycemia: Secondary | ICD-10-CM | POA: Diagnosis not present

## 2022-03-02 DIAGNOSIS — R739 Hyperglycemia, unspecified: Secondary | ICD-10-CM

## 2022-03-02 LAB — URINALYSIS, ROUTINE W REFLEX MICROSCOPIC
Bilirubin Urine: NEGATIVE
Glucose, UA: >=500 mg/dL — AB
Hgb urine dipstick: NEGATIVE
Ketones, ur: 5 mg/dL — AB
Nitrite: NEGATIVE
Protein, ur: NEGATIVE mg/dL
Specific Gravity, Urine: 1.037 — ABNORMAL HIGH (ref 1.005–1.030)
pH: 6 (ref 5.0–8.0)

## 2022-03-02 LAB — CBC WITH DIFFERENTIAL/PLATELET
Abs Immature Granulocytes: 0.03 10*3/uL (ref 0.00–0.07)
Basophils Absolute: 0.1 10*3/uL (ref 0.0–0.1)
Basophils Relative: 1 %
Eosinophils Absolute: 0.2 10*3/uL (ref 0.0–0.5)
Eosinophils Relative: 2 %
HCT: 40.8 % (ref 36.0–46.0)
Hemoglobin: 14.4 g/dL (ref 12.0–15.0)
Immature Granulocytes: 0 %
Lymphocytes Relative: 24 %
Lymphs Abs: 2.4 10*3/uL (ref 0.7–4.0)
MCH: 28.7 pg (ref 26.0–34.0)
MCHC: 35.3 g/dL (ref 30.0–36.0)
MCV: 81.4 fL (ref 80.0–100.0)
Monocytes Absolute: 0.6 10*3/uL (ref 0.1–1.0)
Monocytes Relative: 6 %
Neutro Abs: 6.5 10*3/uL (ref 1.7–7.7)
Neutrophils Relative %: 67 %
Platelets: 277 10*3/uL (ref 150–400)
RBC: 5.01 MIL/uL (ref 3.87–5.11)
RDW: 12 % (ref 11.5–15.5)
WBC: 9.7 10*3/uL (ref 4.0–10.5)
nRBC: 0 % (ref 0.0–0.2)

## 2022-03-02 LAB — COMPREHENSIVE METABOLIC PANEL
ALT: 14 U/L (ref 0–44)
AST: 17 U/L (ref 15–41)
Albumin: 4.1 g/dL (ref 3.5–5.0)
Alkaline Phosphatase: 42 U/L (ref 38–126)
Anion gap: 9 (ref 5–15)
BUN: 10 mg/dL (ref 6–20)
CO2: 26 mmol/L (ref 22–32)
Calcium: 9.1 mg/dL (ref 8.9–10.3)
Chloride: 100 mmol/L (ref 98–111)
Creatinine, Ser: 0.73 mg/dL (ref 0.44–1.00)
GFR, Estimated: >60 mL/min (ref >60)
Glucose, Bld: 345 mg/dL — ABNORMAL HIGH (ref 70–99)
Potassium: 3.9 mmol/L (ref 3.5–5.1)
Sodium: 135 mmol/L (ref 135–145)
Total Bilirubin: 0.4 mg/dL (ref 0.3–1.2)
Total Protein: 7.6 g/dL (ref 6.5–8.1)

## 2022-03-02 LAB — CBG MONITORING, ED: Glucose-Capillary: 362 mg/dL — ABNORMAL HIGH (ref 70–99)

## 2022-03-02 LAB — POC URINE PREG, ED: Preg Test, Ur: NEGATIVE

## 2022-03-02 LAB — LIPASE, BLOOD: Lipase: 35 U/L (ref 11–51)

## 2022-03-02 MED ORDER — IOHEXOL 300 MG/ML  SOLN
100.0000 mL | Freq: Once | INTRAMUSCULAR | Status: AC | PRN
  Administered 2022-03-02: 100 mL via INTRAVENOUS

## 2022-03-02 NOTE — Discharge Instructions (Signed)
Your blood work shows that you have a high blood sugar.  There is no infection in your urine.  Your ultrasound does not show any ovarian twisting or cysts.  Your CAT scan is also normal.  Please continue to take Tylenol per package instructions to help with your pain.  Please return for any new, worsening, or change in symptoms or other concerns.  It was a pleasure caring for you today.

## 2022-03-02 NOTE — ED Triage Notes (Signed)
Pt here with abd pain. Pt was here yesterday but left due tot he wait time. Pt states pain is in the LLQ and her pelvis. Pt states pain is intermittent and has some nausea but denies diarrhea.

## 2022-03-02 NOTE — ED Provider Notes (Signed)
[facility] Provider Note    Event Date/Time   First MD Initiated Contact with Patient 03/02/22 [phone]     (approximate)   History   Abdominal Pain   HPI  Karen Johnson is a 21 y.o. female with a past medical history of diabetes who presents today for evaluation of left lower quadrant pain.  Patient reports that this has been ongoing for the past 3 days.  She describes the pain as mild and constant.  She reports that she has a history of an ovarian cyst rupture and is wondering if this is what is going on today.  She denies any nausea or vomiting.  She denies any burning with urination or vaginal discharge.  She has not had a fever or chills.  She came to the emergency department yesterday but the wait was too long so she left.  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      Excl. in GC?     Most recent vital signs: Vitals:   03/02/22 0703 03/02/22 0944  BP: 123/76 108/69  Pulse: 97 78  Resp: 18 18  Temp:  98 F (36.7 C)  SpO2: 96% 99%    Physical Exam Vitals and nursing note reviewed.  Constitutional:      General: Awake and alert. No acute distress.    Appearance: Normal appearance. The patient is overweight.  HENT:     Head: Normocephalic and atraumatic.     Mouth: Mucous membranes are moist.  Eyes:     General: PERRL. Normal EOMs        Right eye: No discharge.        Left eye: No discharge.     Conjunctiva/sclera: Conjunctivae normal.  Cardiovascular:     Rate and Rhythm:  Normal rate and regular rhythm.     Pulses: Normal pulses.     Heart sounds: Normal heart sounds Pulmonary:     Effort: Pulmonary effort is normal. No respiratory distress.     Breath sounds: Normal breath sounds.  Abdominal:     Abdomen is soft. There is mild left lower quadrant and suprapubic abdominal tenderness. No rebound or guarding. No distention. Musculoskeletal:        General: No swelling. Normal range of motion.     Cervical back: Normal range of motion and neck supple.  Skin:    General: Skin is warm and dry.     Capillary Refill: Capillary refill takes less than 2 seconds.     Findings: No rash.  Neurological:     Mental Status: The patient is awake and alert.      ED Results / Procedures / Treatments  Labs (all labs ordered are listed, but only abnormal results are displayed) Labs Reviewed  COMPREHENSIVE METABOLIC PANEL - Abnormal; Notable for the following components:      Result Value   Glucose, Bld 345 (*)    All other components within normal limits  URINALYSIS, ROUTINE W REFLEX MICROSCOPIC - Abnormal; Notable for the following components:   Color, Urine YELLOW (*)    APPearance HAZY (*)    Specific Gravity, Urine 1.037 (*)    Glucose, UA >=500 (*)    Ketones, ur 5 (*)    Leukocytes,Ua SMALL (*)    Bacteria, UA RARE (*)    All other components within normal limits  CBG MONITORING, ED - Abnormal; Notable for the following components:   Glucose-Capillary 362 (*)    All other components within normal limits  CBC WITH DIFFERENTIAL/PLATELET  LIPASE, BLOOD  POC URINE PREG, ED     EKG     RADIOLOGY I independently reviewed and interpreted imaging and agree with radiologists findings.     PROCEDURES:  Critical Care performed:   Procedures   MEDICATIONS ORDERED IN ED: Medications  iohexol (OMNIPAQUE) 300 MG/ML solution 100 mL (100 mLs Intravenous Contrast Given 03/02/22 0924)     IMPRESSION / MDM / ASSESSMENT AND PLAN / ED COURSE  I  reviewed the triage vital signs and the nursing notes.   Differential diagnosis includes, but is not limited to, ovarian cyst, ovarian torsion, cyst rupture, ectopic pregnancy, diverticulitis.  Patient is awake and alert, hemodynamically stable and afebrile.  She demonstrates no acute distress.  She has minimal reproducible tenderness to her left lower quadrant.  Labs obtained and overall reassuring, she was noted to be hyperglycemic, however she has a normal bicarb, normal anion gap, do not suspect DKA.  Urinalysis reveals large amount of glucose but without signs of infection.  She denies vaginal discharge or concern for sexually transmitted disease.  She declined pelvic exam today.  Ultrasound was obtained and demonstrates no acute pelvic pathology.  Discussed the option of proceeding with CT scan for further evaluation, and patient would like to proceed with this.  CT scan is also reassuring.  We did discuss strict return precautions and outpatient follow-up.  She was advised that she may need to be restarted on her diabetes medications and I advised close outpatient follow-up with her PCP for this.  Patient understands and agrees with plan.  She requested a work note which was provided.  She was discharged in stable condition.   Patient's presentation is most consistent with acute complicated illness / injury requiring diagnostic workup.   Clinical Course as of 03/02/22 1028  Sat Mar 02, 2022  0859 Ultrasound is reassuring.  Patient continues to have pain and would like to proceed with CT scan [JP]    Clinical Course User Index [JP] Westley Blass, Herb Grays, PA-C     FINAL CLINICAL IMPRESSION(S) / ED DIAGNOSES   Final diagnoses:  LLQ pain  Hyperglycemia     Rx / DC Orders   ED Discharge Orders     None        Note:  This document was prepared using Dragon voice recognition software and may include unintentional dictation errors.   Jackelyn Hoehn, PA-C 03/02/22 1029    Sharyn Creamer, MD 03/02/22 [phone]

## 2022-03-02 NOTE — ED Notes (Signed)
See triage  note  Presents with some left lower abd pain   States pain started couple of days ago  Denies any fever,n/v/d or urinary sx's at present  States she did have 1 episode of nausea while at work

## 2022-05-01 ENCOUNTER — Encounter: Payer: Self-pay | Admitting: Advanced Practice Midwife

## 2022-05-01 ENCOUNTER — Ambulatory Visit: Payer: Self-pay | Admitting: Advanced Practice Midwife

## 2022-05-01 DIAGNOSIS — Z113 Encounter for screening for infections with a predominantly sexual mode of transmission: Secondary | ICD-10-CM

## 2022-05-01 DIAGNOSIS — L732 Hidradenitis suppurativa: Secondary | ICD-10-CM

## 2022-05-01 DIAGNOSIS — Z3009 Encounter for other general counseling and advice on contraception: Secondary | ICD-10-CM

## 2022-05-01 LAB — HM HIV SCREENING LAB: HM HIV Screening: NEGATIVE

## 2022-05-01 NOTE — Progress Notes (Signed)
[facility] Department  STI clinic/screening visit [place] [id number] [phone]  Subjective:  Karen Johnson is a 21 y.o. SBF G1P0 exsmoker female being seen today for an STI screening visit. The patient reports they do not have symptoms.  Patient reports that they do not desire a pregnancy in the next year.   They reported they are not interested in discussing contraception today.    Patient's last menstrual period was 04/09/2022 (exact date).   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 Last sex 04/23/22 without condom; with current partner x 3 years. LMP 04/09/22. Last vaped 2 years ago. Last MJ 2 mo ago. Last ETOH 04/23/22 (6 shots vodka) q month. C/o boils that come and go and squeezed on right mons that hurts.   Does the patient using douching products? No  Last HIV test per patient/review of record was  Lab Results  Component Value Date   HMHIVSCREEN Negative - Validated 12/21/2020    Lab Results  Component Value Date   HIV non reactive 08/01/2020   Patient reports last pap was No results found for: "DIAGPAP"   Screening for MPX risk: Does the patient have an unexplained rash? No Is the patient MSM? No Does the patient endorse multiple sex partners or anonymous sex partners? No Did the patient have close or sexual contact with a person diagnosed with MPX? No Has the patient traveled outside the [place] where MPX is endemic? No Is there a high clinical suspicion for MPX-- evidenced by one of the following No  -Unlikely to be chickenpox  -Lymphadenopathy  -Rash  that present in same phase of evolution on any given body part See flowsheet for further details and programmatic requirements.   Immunization history:  Immunization History  Administered Date(s) Administered   Hepatitis A 03/06/2010, 09/20/2013   Hepatitis B 2000-05-09, 07/10/2000, 02/18/2001   Hpv-Unspecified 09/20/2013   MMR 11/19/2001, 03/14/2006   Meningococcal Conjugate 09/20/2013   Pneumococcal Conjugate PCV 7 07/31/2000, 10/23/2000, 02/18/2001   Tdap 03/17/2012   Varicella 08/03/2002, 03/14/2006     The following portions of the patient's history were reviewed and updated as appropriate: allergies, current medications, past medical history, past social history, past surgical history and problem list.  Objective:  There were no vitals filed for this visit.  Physical Exam Vitals and nursing note reviewed.  Constitutional:      Appearance: Normal appearance. She is obese.  HENT:     Head: Normocephalic and atraumatic.     Mouth/Throat:     Mouth: Mucous membranes are moist.     Pharynx: Oropharynx is clear. No oropharyngeal exudate or posterior oropharyngeal erythema.  Eyes:     Conjunctiva/sclera: Conjunctivae normal.  Pulmonary:     Effort: Pulmonary effort is normal.  Abdominal:     Palpations: Abdomen is soft. There is no mass.     Tenderness: There is no abdominal tenderness. There is no rebound.     Comments: Soft without masses or tenderness  Genitourinary:    General: Normal vulva.     Exam position: Lithotomy position.  Pubic Area: No rash or pubic lice.      Labia:        Right: No rash or lesion.        Left: No rash or lesion.      Vagina: Vaginal discharge (white creamy leukorrhea, ph>4.5) present. No erythema, bleeding or lesions.     Cervix: Normal.     Uterus: Normal.      Adnexa: Right adnexa normal and left adnexa normal.     Rectum: Normal.       Comments: pH = >4.5 Right mons sl erythematous, firm, sl tender 2x2 cm cystic area that pt  states was a boil that she tried to squeeze and pop Lymphadenopathy:     Head:     Right side of head: No preauricular or posterior auricular adenopathy.     Left side of head: No preauricular or posterior auricular adenopathy.     Cervical: No cervical adenopathy.     Right cervical: No superficial, deep or posterior cervical adenopathy.    Left cervical: No superficial, deep or posterior cervical adenopathy.     Upper Body:     Right upper body: No supraclavicular, axillary or epitrochlear adenopathy.     Left upper body: No supraclavicular, axillary or epitrochlear adenopathy.     Lower Body: No right inguinal adenopathy. No left inguinal adenopathy.  Skin:    General: Skin is warm and dry.     Findings: No rash.  Neurological:     Mental Status: She is alert and oriented to person, place, and time.      Assessment and Plan:  Karen Johnson is a 21 y.o. female presenting to the [facility] Department for STI screening  1. Screening examination for venereal disease Treat wet mount per standing orders Immunization nurse consult   - Syphilis Serology, [facility] - HIV [place] [facility] - Chlamydia/Gonorrhea  Lab - WET PREP FOR [place], YEAST, CLUE - Gonococcus culture  2. Family planning Pt desires PT today - Pregnancy, urine  3. Hidradenitis suppurativa Counseling given To try warm baths BID x 20 min and if cyst not resolving in 2 days then needs to go to primary care MD or urgent care for possible I&D Info given on hidradenitis suppurativa     Return if symptoms worsen or fail to improve.  No future appointments.  Herbie Saxon, CNM

## 2022-05-01 NOTE — Progress Notes (Signed)
Pt appointment for PT and STD screenings. PT negative. Seen by Lesia Sago. Initial lab results reviewed with pt. Local PCP list given to pt.

## 2022-05-02 ENCOUNTER — Other Ambulatory Visit: Payer: Self-pay

## 2022-05-02 ENCOUNTER — Encounter: Payer: Self-pay | Admitting: Emergency Medicine

## 2022-05-02 DIAGNOSIS — Z794 Long term (current) use of insulin: Secondary | ICD-10-CM | POA: Diagnosis not present

## 2022-05-02 DIAGNOSIS — N764 Abscess of vulva: Secondary | ICD-10-CM | POA: Diagnosis not present

## 2022-05-02 DIAGNOSIS — L02215 Cutaneous abscess of perineum: Secondary | ICD-10-CM | POA: Diagnosis not present

## 2022-05-02 DIAGNOSIS — E119 Type 2 diabetes mellitus without complications: Secondary | ICD-10-CM | POA: Diagnosis not present

## 2022-05-02 LAB — WET PREP FOR TRICH, YEAST, CLUE
Trichomonas Exam: NEGATIVE
Yeast Exam: NEGATIVE

## 2022-05-02 LAB — PREGNANCY, URINE: Preg Test, Ur: NEGATIVE

## 2022-05-02 NOTE — ED Triage Notes (Signed)
Patient ambulatory to triage with steady gait, without difficulty or distress noted; pt reports vaginal abscess for few days; denies hx of same

## 2022-05-03 ENCOUNTER — Emergency Department
Admission: EM | Admit: 2022-05-03 | Discharge: 2022-05-03 | Disposition: A | Discharge status: Home / Self Care | Payer: BC Managed Care – PPO | Attending: Emergency Medicine | Admitting: Emergency Medicine

## 2022-05-03 DIAGNOSIS — L0291 Cutaneous abscess, unspecified: Secondary | ICD-10-CM

## 2022-05-03 DIAGNOSIS — N764 Abscess of vulva: Secondary | ICD-10-CM | POA: Diagnosis not present

## 2022-05-03 MED ORDER — SULFAMETHOXAZOLE-TRIMETHOPRIM 800-160 MG PO TABS: 1.0000 | ORAL_TABLET | Freq: Two times a day (BID) | ORAL | 0 refills | Status: AC

## 2022-05-03 MED ORDER — SULFAMETHOXAZOLE-TRIMETHOPRIM 800-160 MG PO TABS
1.0000 | ORAL_TABLET | Freq: Once | ORAL | Status: AC
  Administered 2022-05-03: 1 via ORAL
  Filled 2022-05-03: qty 1

## 2022-05-03 MED ORDER — LIDOCAINE-EPINEPHRINE 2 %-1:100000 IJ SOLN
10.0000 mL | Freq: Once | INTRAMUSCULAR | Status: DC
  Filled 2022-05-03: qty 1

## 2022-05-03 NOTE — ED Provider Notes (Signed)
[facility] Provider Note    Event Date/Time   First MD Initiated Contact with Patient 05/03/22 0104     (approximate)   History   Abscess   HPI  [place] D Kope is a 21 y.o. female   Past medical history of insulin dependent diabetic, hidradenitis suppurativa who presents to the emergency department with groin abscess.  She states that the pain started about 1 week ago and had an overlying pimple like protrusion on her mons which she tried to pop, subsequently developed a mass that was painful.  No systemic infectious symptoms like fevers chills nausea or vomiting.  Denies vaginal bleeding, discharge, dysuria.  History was obtained via the patient.      Physical Exam   Triage Vital Signs: ED Triage Vitals  Enc Vitals Group     BP 05/02/22 2318 123/80     Pulse Rate 05/02/22 2318 91     Resp 05/02/22 2318 18     Temp 05/02/22 2318 99.1 F (37.3 C)     Temp Source 05/02/22 2318 Oral     SpO2 05/02/22 2318 99 %     Weight 05/02/22 2315 211 lb (95.7 kg)     Height 05/02/22 2315 5\' 10"  (1.778 m)     Head Circumference --      Peak Flow --      Pain Score 05/02/22 2315 8     Pain Loc --      Pain Edu? --      Excl. in GC? --     Most recent vital signs: Vitals:   05/02/22 2318  BP: 123/80  Pulse: 91  Resp: 18  Temp: 99.1 F (37.3 C)  SpO2: 99%    General: Awake, no distress.  CV:  Good peripheral perfusion.  Resp:  Normal effort.  Abd:  No distention.  Other:  Indurated mass on the right mons pubis, no fluctuance, no drainage, tenderness to palpation, no overlying skin changes like erythema, warmth, crepitus.  The vaginal introitus appears normal.  Bedside ultrasound shows a fluid collection approximately 2 x 3 cm.   ED Results / Procedures / Treatments   Labs (all labs ordered are listed, but only abnormal results are displayed) Labs Reviewed - No data to display   PROCEDURES:  Critical Care performed: No  ..Incision and  Drainage  Date/Time: 05/03/2022 3:14 AM  Performed by: 13/03/2022, MD Authorized by: Pilar Jarvis, MD   Consent:    Consent obtained:  Verbal   Consent given by:  Patient   Risks, benefits, and alternatives were discussed: yes     Risks discussed:  Bleeding, incomplete drainage, pain, damage to other organs and infection   Alternatives discussed:  No treatment Universal protocol:    Procedure explained and questions answered to patient or proxy's satisfaction: yes     Relevant documents present and verified: yes     Test results available : yes     Imaging studies available: yes     Required blood products, implants, devices, and special equipment available: no     Immediately prior to procedure, a time out was called: yes     Patient identity confirmed:  Verbally with patient Location:    Type:  Abscess   Size:  3-4cm   Location:  Anogenital   Anogenital location: Mons pubis. Pre-procedure details:    Skin preparation:  Antiseptic wash Sedation:    Sedation type:  None Anesthesia:    Anesthesia method:  Local infiltration   Local anesthetic:  Lidocaine 1% WITH epi Procedure type:    Complexity:  Complex (Probing for loculation break-up) Procedure details:    Ultrasound guidance: yes     Needle aspiration: no     Incision types:  Stab incision   Incision depth:  Dermal   Wound management:  Probed and deloculated   Drainage:  Purulent and bloody   Drainage amount:  Moderate   Wound treatment:  Wound left open   Packing materials:  None Post-procedure details:    Procedure completion:  Tolerated well, no immediate complications    MEDICATIONS ORDERED IN ED: Medications  lidocaine-EPINEPHrine (XYLOCAINE W/EPI) 2 %-1:100000 (with pres) injection 10 mL (has no administration in time range)  sulfamethoxazole-trimethoprim (BACTRIM DS) 800-160 MG per tablet 1 tablet (has no administration in time range)    IMPRESSION / MDM / ASSESSMENT AND PLAN / ED COURSE  I reviewed  the triage vital signs and the nursing notes.                              Differential diagnosis includes, but is not limited to, abscess, skin infection, hidradenitis suppurativa, considered but less likely systemic infection like sepsis, STI, UTI   MDM: This patient has an abscess on her mons pubis, fluid collection on ultrasound, discussed I&D with the patient and she is agreeable, antibiotics given history of diabetes insulin-dependent.   Patient's presentation is most consistent with acute complicated illness / injury requiring diagnostic workup.       FINAL CLINICAL IMPRESSION(S) / ED DIAGNOSES   Final diagnoses:  Abscess     Rx / DC Orders   ED Discharge Orders          Ordered    sulfamethoxazole-trimethoprim (BACTRIM DS) 800-160 MG tablet  2 times daily        05/03/22 0135             Note:  This document was prepared using Dragon voice recognition software and may include unintentional dictation errors.    Pilar Jarvis, MD 05/03/22 [phone]

## 2022-05-03 NOTE — Discharge Instructions (Addendum)
Take antibiotics for the full course as prescribed. Take acetaminophen 650 mg and ibuprofen 400 mg every 6 hours for pain.  Take with food.  Call your primary care doctor for an appointment next week to check on the wound.  Thank you for choosing [place] for your health care today!  Please see your primary doctor this week for a follow up appointment.   If you do not have a primary doctor call the following clinics to establish care:  If you have insurance:  [facility] [phone] [address] [place] [place] [address]   [facility]  [phone] [address] [place] [place] [address]   If you do not have insurance:  [facility]  [phone] [address] [place] [address]  Sometimes, in the early stages of certain disease courses it is difficult to detect in the emergency department evaluation -- so, it is important that you continue to monitor your symptoms and call your doctor right away or return to the emergency department if you develop any new or worsening symptoms.  It was my pleasure to care for you today.   Daneil Dan Modesto Charon, MD

## 2022-05-06 LAB — GONOCOCCUS CULTURE

## 2022-07-05 NOTE — Addendum Note (Signed)
Addended by: Cletis Media on: 07/05/2022 03:17 PM   Modules accepted: Orders

## 2022-09-06 ENCOUNTER — Ambulatory Visit: Payer: BC Managed Care – PPO

## 2022-10-08 IMAGING — US US PELVIS COMPLETE TRANSABD/TRANSVAG W DUPLEX AND/OR DOPPLER
1 series · 13 of 25 positions shown · non-contrast
Comparison: None Available.

CLINICAL DATA: Pelvic pain

EXAM:
TRANSABDOMINAL AND TRANSVAGINAL ULTRASOUND OF PELVIS
DOPPLER ULTRASOUND OF OVARIES
TECHNIQUE: Both transabdominal and transvaginal ultrasound examinations of the
pelvis were performed. Transabdominal technique was performed for
global imaging of the pelvis including uterus, ovaries, adnexal
regions, and pelvic cul-de-sac.
It was necessary to proceed with endovaginal exam following the
transabdominal exam to visualize the endometrium and ovaries. Color
and duplex Doppler ultrasound was utilized to evaluate blood flow to
the ovaries.

[Series 1: us pelvic complete w transvaginal and torsion righ · 13 of 107 slices shown]
[im 1/107]
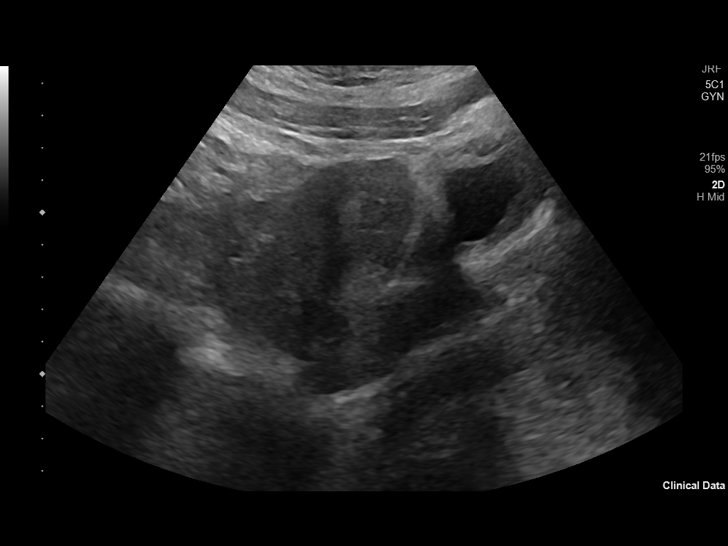
[im 9/107]
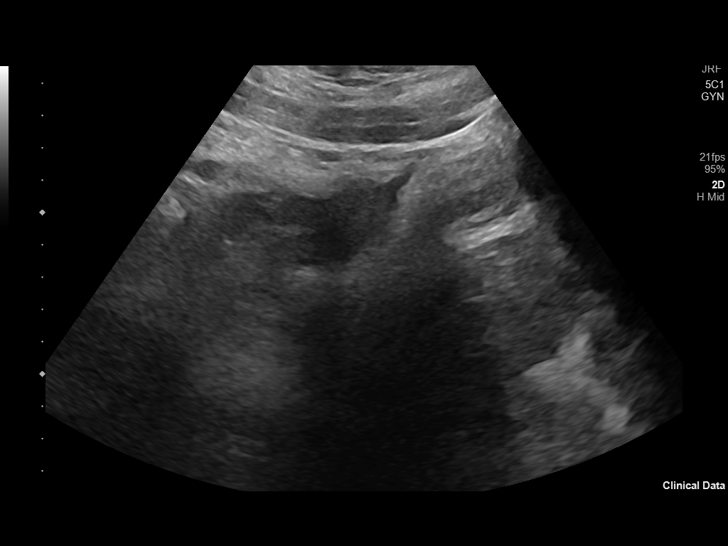
[im 18/107]
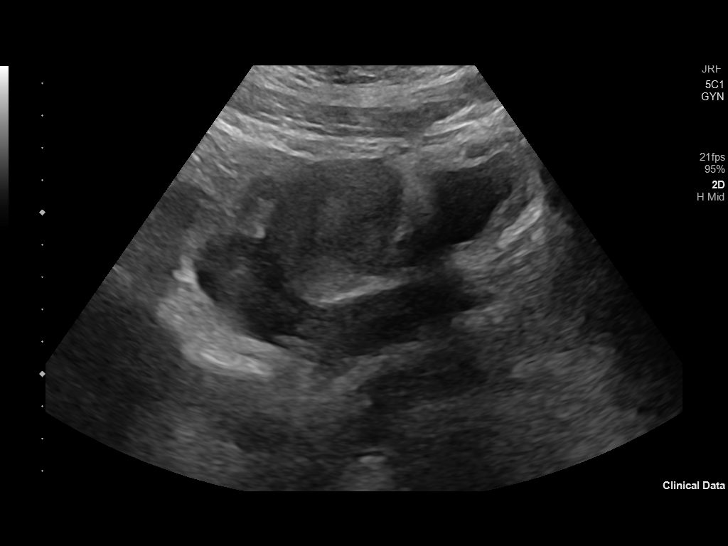
[im 27/107]
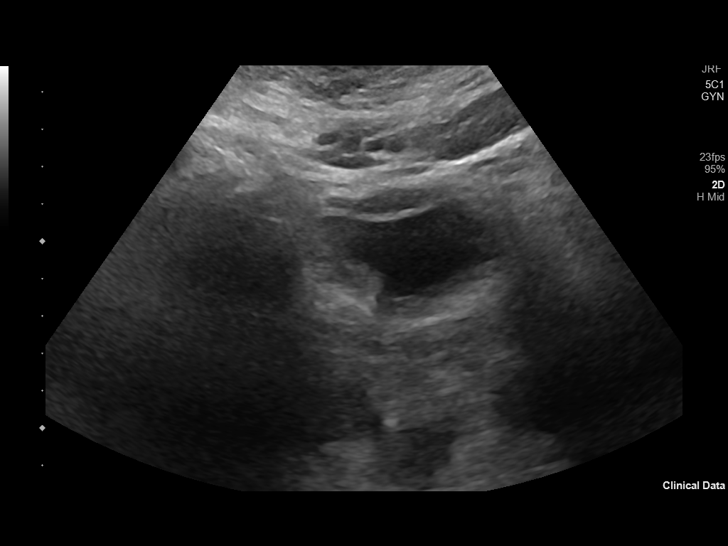
[im 36/107]
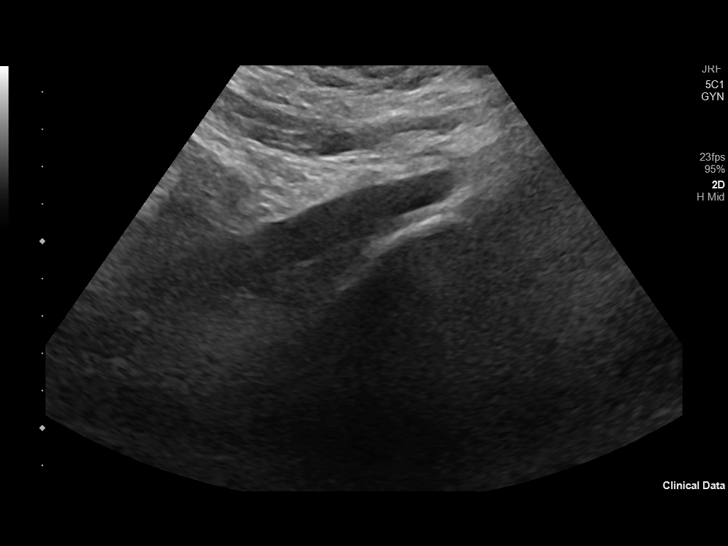
[im 45/107]
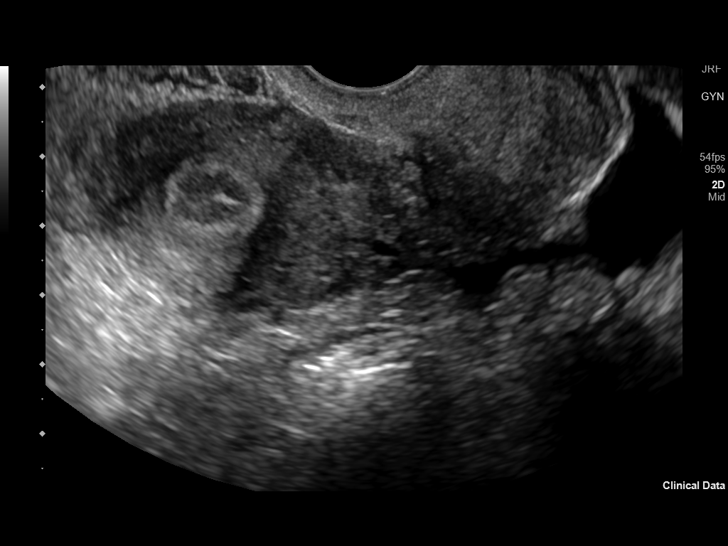
[im 54/107]
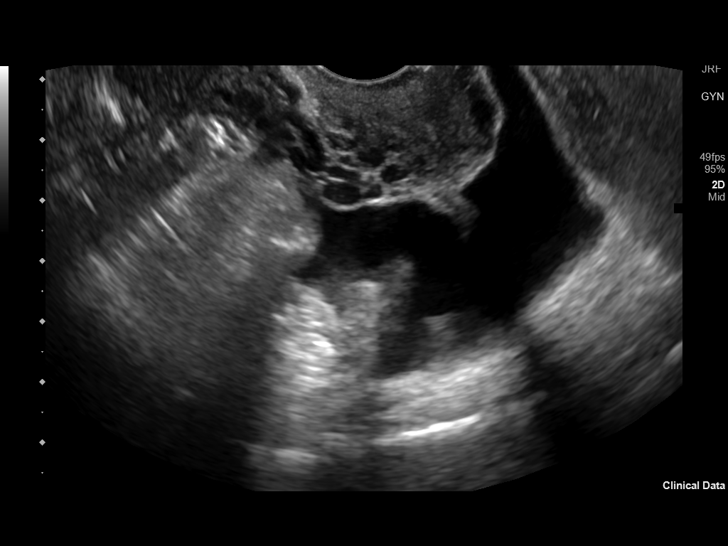
[im 62/107]
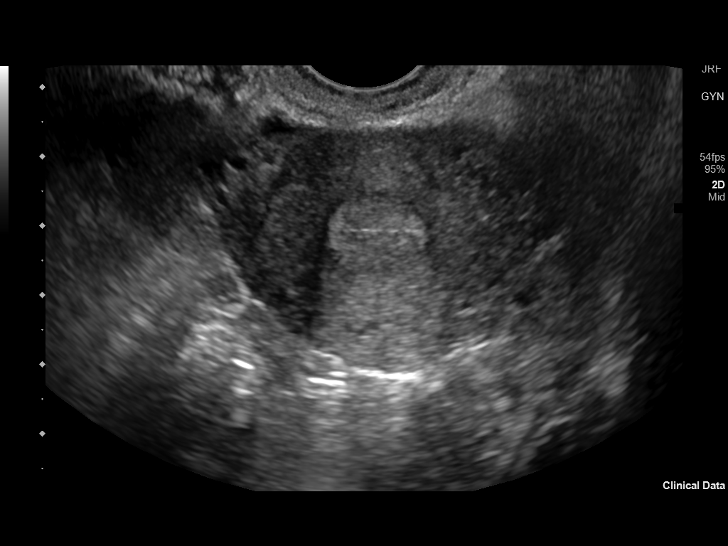
[im 71/107]
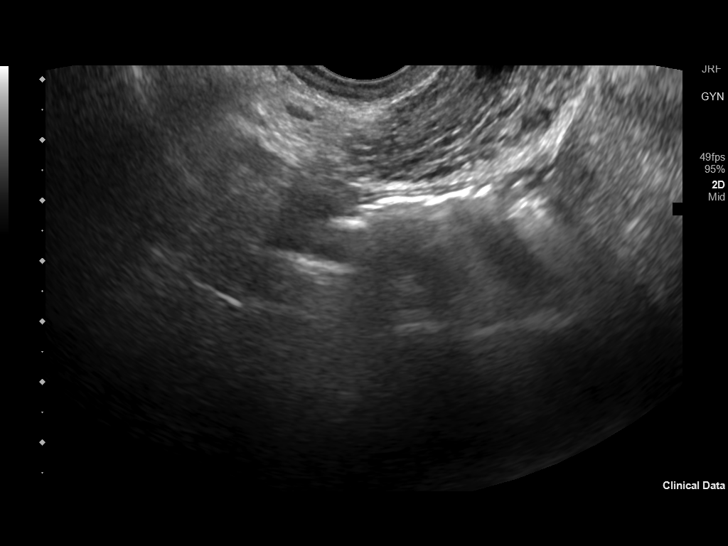
[im 80/107]
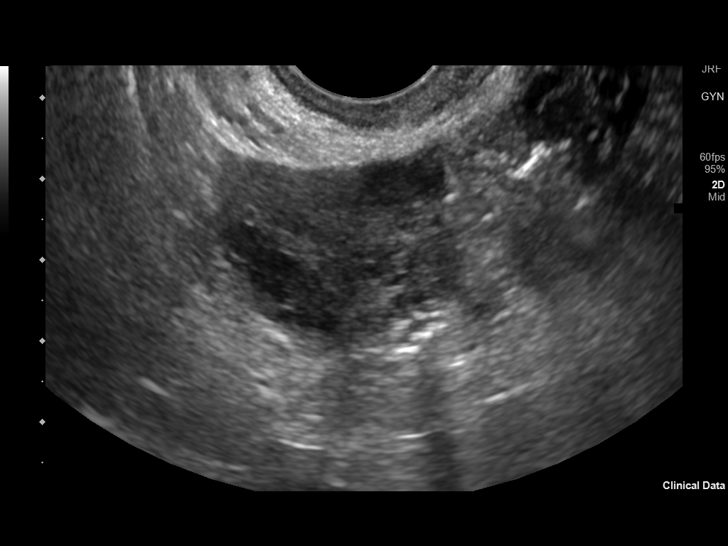
[im 89/107]
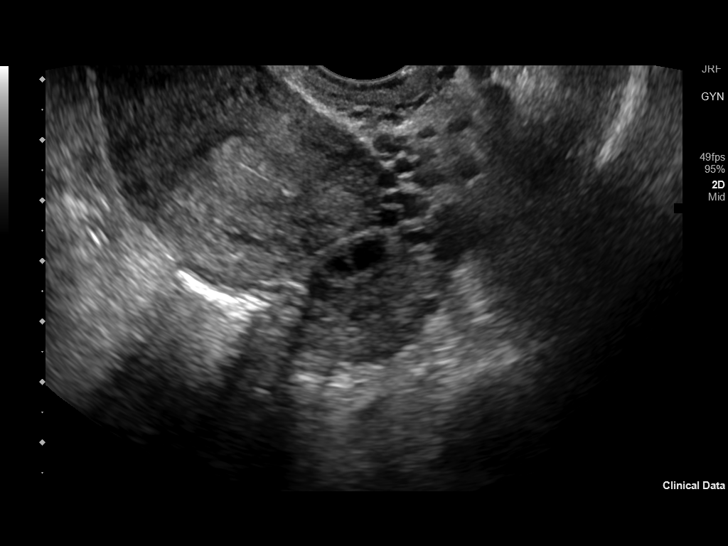
[im 98/107]
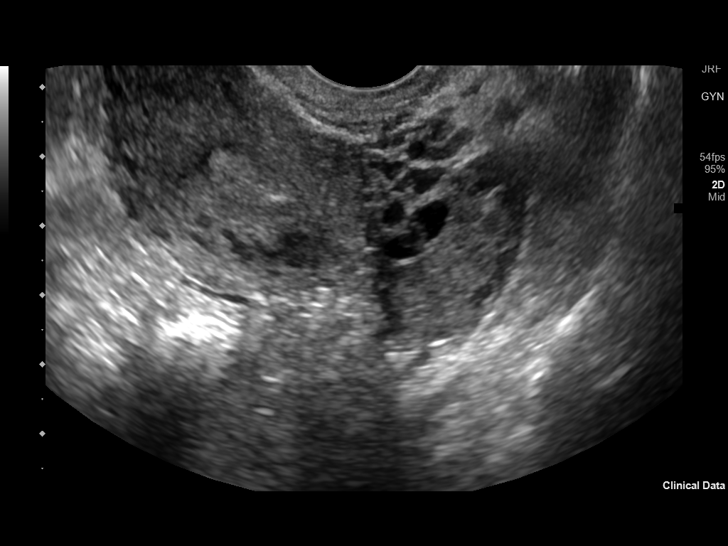
[im 107/107]
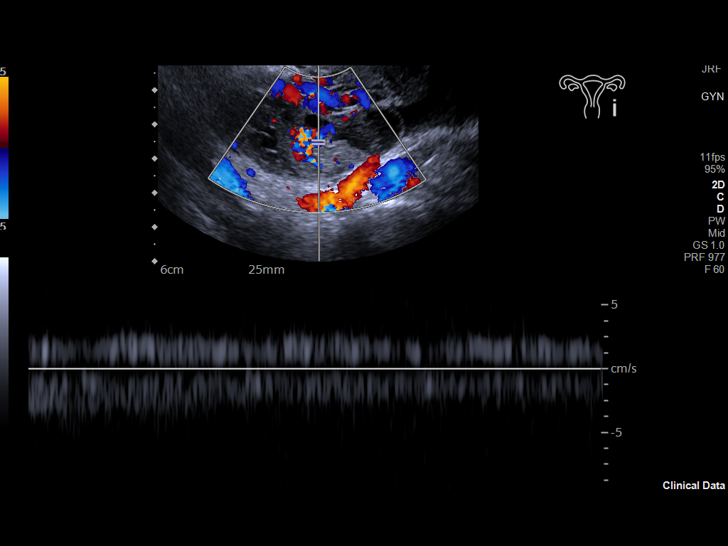

[13 of 25 positions shown; findings below may reference images not displayed]

FINDINGS: Uterus

Measurements: 8 x 3.6 x 4.8 cm = volume: 72 mL. No fibroids or other
mass visualized.

Endometrium

Thickness: 11 mm. No focal abnormality is seen. Prominence of
endometrial stripe may be due to secretory phase of menstrual cycle.

Right ovary

Measurements: 3.7 x 2.1 x 2 cm = volume: 8 mL. Normal appearance/no
adnexal mass.

Left ovary

Measurements: 4.7 x 2.2 x 2 cm = volume: 11 mL. There is 2 x 1.5 cm
hypoechoic structure in the left adnexa, possibly hemorrhagic
cyst/follicle.

Pulsed Doppler evaluation of both ovaries demonstrates normal
low-resistance arterial and venous waveforms.

Other findings

There is small to moderate amount of free fluid in pelvis.
IMPRESSION: Small to moderate amount of free fluid in the cul-de-sac may suggest
recent rupture of ovarian cyst or follicle. There is 2 cm hypoechoic
structure in the left adnexa suggesting hemorrhagic follicle/cyst.

There is no evidence of ovarian torsion.  Uterus is unremarkable.

## 2022-12-04 ENCOUNTER — Other Ambulatory Visit: Payer: Self-pay

## 2022-12-04 ENCOUNTER — Emergency Department: Payer: Managed Care, Other (non HMO)

## 2022-12-04 ENCOUNTER — Emergency Department
Admission: EM | Admit: 2022-12-04 | Discharge: 2022-12-04 | Disposition: A | Discharge status: Home / Self Care | Payer: Managed Care, Other (non HMO) | Attending: Emergency Medicine | Admitting: Emergency Medicine

## 2022-12-04 DIAGNOSIS — M25562 Pain in left knee: Secondary | ICD-10-CM | POA: Insufficient documentation

## 2022-12-04 LAB — POC URINE PREG, ED: Preg Test, Ur: NEGATIVE

## 2022-12-04 MED ORDER — MELOXICAM 15 MG PO TABS: 15.0000 mg | ORAL_TABLET | Freq: Every day | ORAL | 1 refills | Status: AC

## 2022-12-04 NOTE — Discharge Instructions (Addendum)
Take Meloxicam once daily for pain and inflammation. Apply ice at night and before bed.  Keep knee elevated.

## 2022-12-04 NOTE — ED Triage Notes (Signed)
Pt presents to the ED due to knee pain. Pt states it started 2 days ago and is having some troubles walking. Pt A&Ox4

## 2022-12-04 NOTE — ED Provider Notes (Signed)
[facility] Provider Note  Patient Contact: 4:03 PM (approximate)   History   Knee Pain   HPI  Karen Johnson is a 22 y.o. female presents to the emergency department with nontraumatic left knee pain for the past 2 days.  Patient localizes her pain along the medial aspect of the left knee.  She states that she has had some pain with weightbearing but can ambulate.  No numbness or tingling of the left lower extremity.  No prior left knee surgeries.  No erythema overlying the left knee.      Physical Exam   Triage Vital Signs: ED Triage Vitals  Enc Vitals Group     BP 12/04/22 1439 119/77     Pulse Rate 12/04/22 1439 87     Resp 12/04/22 1439 18     Temp 12/04/22 1439 98.5 F (36.9 C)     Temp Source 12/04/22 1439 Oral     SpO2 12/04/22 1439 100 %     Weight 12/04/22 1438 200 lb (90.7 kg)     Height --      Head Circumference --      Peak Flow --      Pain Score 12/04/22 1438 9     Pain Loc --      Pain Edu? --      Excl. in GC? --     Most recent vital signs: Vitals:   12/04/22 1439  BP: 119/77  Pulse: 87  Resp: 18  Temp: 98.5 F (36.9 C)  SpO2: 100%     General: Alert and in no acute distress. Eyes:  PERRL. EOMI. Head: No acute traumatic findings ENT:      Nose: No congestion/rhinnorhea.      Mouth/Throat: Mucous membranes are moist. Neck: No stridor. No cervical spine tenderness to palpation. Cardiovascular:  Good peripheral perfusion Respiratory: Normal respiratory effort without tachypnea or retractions. Lungs CTAB. Good air entry to the bases with no decreased or absent breath sounds. Gastrointestinal: Bowel sounds 4 quadrants. Soft and nontender to palpation. No guarding or rigidity. No palpable masses. No distention. No CVA tenderness. Musculoskeletal: Patient performs limited range of motion at the left knee.  No deficits noted with provocative testing.  Palpable dorsalis pedis pulse, left. Neurologic:  No gross focal  neurologic deficits are appreciated.  Skin:   No rash noted Other:   ED Results / Procedures / Treatments   Labs (all labs ordered are listed, but only abnormal results are displayed) Labs Reviewed  POC URINE PREG, ED       RADIOLOGY  I personally viewed and evaluated these images as part of my medical decision making, as well as reviewing the written report by the radiologist.  ED Provider Interpretation: Mild narrowing along the medial compartment of the left knee.   PROCEDURES:  Critical Care performed: No  Procedures   MEDICATIONS ORDERED IN ED: Medications - No data to display   IMPRESSION / MDM / ASSESSMENT AND PLAN / ED COURSE  I reviewed the triage vital signs and the nursing notes.                              Assessment and plan Left knee pain 22 year old female presents to the emergency department with left knee pain.  X-ray indicates some mild narrowing along the medial aspect of the left knee but no other acute abnormality.  Will recommend rest, ice, compression elevation  and daily meloxicam.  Return precautions were given to return with new or worsening symptoms.   FINAL CLINICAL IMPRESSION(S) / ED DIAGNOSES   Final diagnoses:  Acute pain of left knee     Rx / DC Orders   ED Discharge Orders          Ordered    meloxicam (MOBIC) 15 MG tablet  Daily        12/04/22 1600             Note:  This document was prepared using Dragon voice recognition software and may include unintentional dictation errors.   Pia Mau [place], Cordelia Poche 12/04/22 1605    Chesley Noon, MD 12/04/22 [phone]

## 2023-01-08 ENCOUNTER — Ambulatory Visit: Payer: Managed Care, Other (non HMO)

## 2023-01-08 ENCOUNTER — Encounter: Payer: Self-pay | Admitting: Advanced Practice Midwife

## 2023-01-08 ENCOUNTER — Ambulatory Visit: Payer: Managed Care, Other (non HMO) | Admitting: Advanced Practice Midwife

## 2023-01-08 DIAGNOSIS — Z113 Encounter for screening for infections with a predominantly sexual mode of transmission: Secondary | ICD-10-CM

## 2023-01-08 LAB — WET PREP FOR TRICH, YEAST, CLUE
Trichomonas Exam: NEGATIVE
Yeast Exam: NEGATIVE

## 2023-01-08 LAB — HM HIV SCREENING LAB: HM HIV Screening: NEGATIVE

## 2023-01-08 NOTE — Progress Notes (Addendum)
Pt is here for STD screening. Wet prep results reviewed with pt, no treatment required per standing orders.  Condoms declined. PCP list given. Gaspar Garbe, RN

## 2023-01-08 NOTE — Progress Notes (Signed)
[facility] Department  STI clinic/screening visit [address] [place] [address] [phone]  Subjective:  Karen Johnson is a 22 y.o. SBF vaper G1P0 female being seen today for an STI screening visit. The patient reports they do have symptoms.  Patient reports that they do not desire a pregnancy in the next year.   They reported they are not interested in discussing contraception today.    Patient's last menstrual period was 01/04/2023 (exact date).  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 Hx hidradenitis suppurative and I&D in ER with antibiotics 05/01/22. Last sex 12/26/22 without condom; with current partner first time; 1 partner in last 3 mo. LMP 01/01/23. Current vaper. Last MJ 2023. Last ETOH 01/06/23 (7 shots Tequila) 2x/mo.   Does the patient using douching products? No  Last HIV test per patient/review of record was  Lab Results  Component Value Date   HMHIVSCREEN Negative - Validated 05/01/2022    Lab Results  Component Value Date   HIV non reactive 08/01/2020   Patient reports last pap was No results found for: "DIAGPAP" No results found for: "SPECADGYN"  Screening for MPX risk: Does the patient have an unexplained rash? No Is the patient MSM? No Does the patient endorse multiple sex partners or anonymous sex partners? No Did the patient have close or sexual contact with a person diagnosed with MPX? No Has the patient traveled outside the [place] where MPX is endemic? No Is there a high clinical suspicion for MPX-- evidenced by one of the following No  -Unlikely to be  chickenpox  -Lymphadenopathy  -Rash that present in same phase of evolution on any given body part See flowsheet for further details and programmatic requirements.   Immunization history:  Immunization History  Administered Date(s) Administered   Hepatitis A 03/06/2010, 09/20/2013   Hepatitis B Oct [birth year], 1999, 07/10/2000, 02/18/2001   Hpv-Unspecified 09/20/2013   MMR 11/19/2001, 03/14/2006   Meningococcal Conjugate 09/20/2013   Pneumococcal Conjugate PCV 7 07/31/2000, 10/23/2000, 02/18/2001   Tdap 03/17/2012   Varicella 08/03/2002, 03/14/2006     The following portions of the patient's history were reviewed and updated as appropriate: allergies, current medications, past medical history, past social history, past surgical history and problem list.  Objective:  There were no vitals filed for this visit.  Physical Exam Vitals and nursing note reviewed.  Constitutional:      Appearance: Normal appearance. She is obese.  HENT:     Head: Normocephalic and atraumatic.     Mouth/Throat:     Mouth: Mucous membranes are moist.     Pharynx: Oropharynx is clear. No oropharyngeal exudate or posterior oropharyngeal erythema.  Eyes:     Conjunctiva/sclera: Conjunctivae normal.  Pulmonary:     Effort: Pulmonary effort is normal.  Abdominal:     Palpations: Abdomen is soft. There is no mass.     Tenderness: There is no abdominal tenderness. There is no rebound.     Comments: Soft without masses or tenderness, poor tone  Genitourinary:    General: Normal vulva.     Exam position:  Lithotomy position.     Pubic Area: No rash or pubic lice.      Labia:        Right: No rash or lesion.        Left: No rash or lesion.      Vagina: Vaginal discharge (light red creamy leukorrhea, ph<4.5) present. No erythema, bleeding or lesions.     Cervix: No cervical motion tenderness, discharge, friability, lesion or erythema.     Uterus: Normal.      Adnexa: Right adnexa normal and left adnexa normal.      Rectum: Normal.       Comments: pH = <4.5 Small firm cyst Hydradinitis suppuritiva right posterior aspect of labia minora onset 1-2 days ago Lymphadenopathy:     Head:     Right side of head: No preauricular or posterior auricular adenopathy.     Left side of head: No preauricular or posterior auricular adenopathy.     Cervical: No cervical adenopathy.     Right cervical: No superficial, deep or posterior cervical adenopathy.    Left cervical: No superficial, deep or posterior cervical adenopathy.     Upper Body:     Right upper body: No supraclavicular, axillary or epitrochlear adenopathy.     Left upper body: No supraclavicular, axillary or epitrochlear adenopathy.     Lower Body: No right inguinal adenopathy. No left inguinal adenopathy.  Skin:    General: Skin is warm and dry.     Findings: No rash.  Neurological:     Mental Status: She is alert and oriented to person, place, and time.      Assessment and Plan:  Karen Johnson is a 22 y.o. female presenting to the [facility] Department for STI screening  1. Screening examination for venereal disease Treat wet mount per standing orders Immunization nurse consult Please give pt primary care MD list Pt counseled again, that primary care MD needs to manage her hidradenitis suppurativa and her diabetes, and needs to make apt asap  - WET PREP FOR TRICH, YEAST, CLUE - Chlamydia/Gonorrhea [place] [facility] - Syphilis Serology, [place] [facility] - HIV [place] [facility] - Gonococcus culture   Patient accepted all screenings including oral, vaginal CT/GC and bloodwork for HIV/RPR, and wet prep. Patient meets criteria for HepB screening? Yes. Ordered? no Patient meets criteria for HepC screening? No. Ordered? no  Treat wet prep per standing order Discussed time line for [facility] results and that patient will be called with positive results and encouraged patient to call if she had not heard in 2 weeks.  Counseled to return or  seek care for continued or worsening symptoms Recommended repeat testing in 3 months with positive results. Recommended condom use with all sex  Patient is currently using  nothing  to prevent pregnancy.    No follow-ups on file.  No future appointments.  Alberteen Spindle, CNM

## 2023-01-12 LAB — GONOCOCCUS CULTURE

## 2023-03-22 ENCOUNTER — Other Ambulatory Visit: Payer: Self-pay

## 2023-03-22 ENCOUNTER — Encounter: Payer: Self-pay | Admitting: Emergency Medicine

## 2023-03-22 ENCOUNTER — Emergency Department
Admission: EM | Admit: 2023-03-22 | Discharge: 2023-03-22 | Disposition: A | Discharge status: Home / Self Care | Payer: Managed Care, Other (non HMO) | Attending: Emergency Medicine | Admitting: Emergency Medicine

## 2023-03-22 DIAGNOSIS — A6004 Herpesviral vulvovaginitis: Secondary | ICD-10-CM | POA: Diagnosis not present

## 2023-03-22 DIAGNOSIS — N898 Other specified noninflammatory disorders of vagina: Secondary | ICD-10-CM | POA: Diagnosis present

## 2023-03-22 MED ORDER — VALACYCLOVIR HCL 1 G PO TABS: 1000.0000 mg | ORAL_TABLET | Freq: Two times a day (BID) | ORAL | 0 refills | Status: AC

## 2023-03-22 NOTE — ED Triage Notes (Signed)
Pt presents ambulatory to triage via POV with complaints of a lac or small cut in her vaginal. Pt states she noticed some burning and stinging when taking a shower. Notes that the pain is worse when "water hits it" - has not been able to visualize a cut nor any swelling. A&Ox4 at this time. Denies bleeding, drainage, fevers, chills, urinary sx.

## 2023-03-22 NOTE — Discharge Instructions (Addendum)
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  This is very contagious and you can transmitted to any sexual partner.  We do not know when you contracted the disease but please read through the included information and take the full 10-day course of medication as prescribed.  We recommend you establish primary care and have put in a referral to help you do so.  We also encourage you to follow-up at the [facility] department if you would like to do additional STD testing at any time.

## 2023-03-22 NOTE — ED Provider Notes (Signed)
[facility] Provider Note    Event Date/Time   First MD Initiated Contact with Patient 03/22/23 [phone]     (approximate)   History   Vaginal Pain   HPI Karen Johnson is a 22 y.o. female who presents for evaluation of possible cut on her vagina or vulva.  She said that for a couple of days she has had pain that feels like a cut and it is either inside her vagina or right around the opening.  She can touch several tender areas down there but she cannot see whether she has any bumps or rash.  She says that she was evaluated fairly recently and checked for STDs which were all negative and she has only 1 partner.  She has never had herpes.  She is not recently had sex that she thinks would result in any sort of traumatic injury and she has not had any vaginal bleeding.  No excessive discharge, just the pain that feels like a laceration.  She has tried putting on Neosporin but it does not seem to help.     Physical Exam   Triage Vital Signs: ED Triage Vitals  Encounter Vitals Group     BP 03/22/23 0408 126/87     Systolic BP Percentile --      Diastolic BP Percentile --      Pulse Rate 03/22/23 0408 73     Resp 03/22/23 0408 18     Temp 03/22/23 0408 97.8 F (36.6 C)     Temp Source 03/22/23 0408 Oral     SpO2 03/22/23 0408 100 %     Weight 03/22/23 0407 91.2 kg (201 lb 1 oz)     Height 03/22/23 0407 1.778 m (5\' 10" )     Head Circumference --      Peak Flow --      Pain Score 03/22/23 0407 10     Pain Loc --      Pain Education --      Exclude from Growth Chart --     Most recent vital signs: Vitals:   03/22/23 0408 03/22/23 0635  BP: 126/87 (!) 141/87  Pulse: 73 69  Resp: 18 18  Temp: 97.8 F (36.6 C) 97.6 F (36.4 C)  SpO2: 100% 100%    General: Awake, no distress.  CV:  Good peripheral perfusion.  Resp:  Normal effort. Speaking easily and comfortably, no accessory muscle usage nor intercostal retractions.   Abd:  No distention.   GU:  Multiple vulvar lesions on labia majora and minora which appear consistent with HSV.  They are tender and are roughly circular ulcerative lesions rather than vesicles.  There is no evidence of abscess or cellulitis.  Nurse present as chaperone throughout exam.   ED Results / Procedures / Treatments   Labs (all labs ordered are listed, but only abnormal results are displayed) Labs Reviewed - No data to display     PROCEDURES:  Critical Care performed: No  Procedures    IMPRESSION / MDM / ASSESSMENT AND PLAN / ED COURSE  I reviewed the triage vital signs and the nursing notes.                              Differential diagnosis includes, but is not limited to, STD, laceration, cellulitis, Fournier's gangrene.  Patient's presentation is most consistent with acute, uncomplicated illness.    Vital signs are essentially normal.  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  Treating with valacyclovir and referring to primary care to establish care.  Also suggested she follow-up with the [facility] department as needed for additional STD testing.  I had my usual genital herpes discussion with the patient and she understands and agrees with the plan.         FINAL CLINICAL IMPRESSION(S) / ED DIAGNOSES   Final diagnoses:  Herpes simplex vulvovaginitis     Rx / DC Orders   ED Discharge Orders          Ordered    valACYclovir (VALTREX) 1000 MG tablet  2 times daily        03/22/23 0617    Ambulatory Referral to Primary Care (Establish Care)        03/22/23 0617             Note:  This document was prepared using Dragon voice recognition software and may include unintentional dictation errors.   Loleta Rose, MD 03/22/23 [phone]

## 2023-03-22 NOTE — ED Notes (Signed)
Patient declined swab and opted for treatment. Provided with discharge instructions including prescriptions x1 and importance of follow up appt as needed with stated understanding. Patient stable and ambulatory with steady even gait on dispo.

## 2023-03-25 ENCOUNTER — Emergency Department
Admission: EM | Admit: 2023-03-25 | Discharge: 2023-03-25 | Disposition: A | Discharge status: Home / Self Care | Payer: Managed Care, Other (non HMO) | Attending: Emergency Medicine | Admitting: Emergency Medicine

## 2023-03-25 ENCOUNTER — Encounter: Payer: Self-pay | Admitting: Emergency Medicine

## 2023-03-25 ENCOUNTER — Other Ambulatory Visit: Payer: Self-pay

## 2023-03-25 DIAGNOSIS — R102 Pelvic and perineal pain: Secondary | ICD-10-CM | POA: Diagnosis present

## 2023-03-25 DIAGNOSIS — A6004 Herpesviral vulvovaginitis: Secondary | ICD-10-CM | POA: Diagnosis not present

## 2023-03-25 DIAGNOSIS — R000 Tachycardia, unspecified: Secondary | ICD-10-CM | POA: Diagnosis not present

## 2023-03-25 DIAGNOSIS — Z794 Long term (current) use of insulin: Secondary | ICD-10-CM | POA: Insufficient documentation

## 2023-03-25 DIAGNOSIS — E119 Type 2 diabetes mellitus without complications: Secondary | ICD-10-CM

## 2023-03-25 DIAGNOSIS — E1165 Type 2 diabetes mellitus with hyperglycemia: Secondary | ICD-10-CM | POA: Insufficient documentation

## 2023-03-25 LAB — COMPREHENSIVE METABOLIC PANEL
ALT: 16 U/L (ref 0–44)
AST: 18 U/L (ref 15–41)
Albumin: 4.7 g/dL (ref 3.5–5.0)
Alkaline Phosphatase: 38 U/L (ref 38–126)
Anion gap: 12 (ref 5–15)
BUN: 13 mg/dL (ref 6–20)
CO2: 24 mmol/L (ref 22–32)
Calcium: 9.4 mg/dL (ref 8.9–10.3)
Chloride: 97 mmol/L — ABNORMAL LOW (ref 98–111)
Creatinine, Ser: 0.77 mg/dL (ref 0.44–1.00)
GFR, Estimated: >60 mL/min (ref >60)
Glucose, Bld: 278 mg/dL — ABNORMAL HIGH (ref 70–99)
Potassium: 3.9 mmol/L (ref 3.5–5.1)
Sodium: 133 mmol/L — ABNORMAL LOW (ref 135–145)
Total Bilirubin: 1 mg/dL (ref 0.3–1.2)
Total Protein: 9.1 g/dL — ABNORMAL HIGH (ref 6.5–8.1)

## 2023-03-25 LAB — URINALYSIS, ROUTINE W REFLEX MICROSCOPIC
Bilirubin Urine: NEGATIVE
Glucose, UA: >=500 mg/dL — AB
Hgb urine dipstick: NEGATIVE
Ketones, ur: 20 mg/dL — AB
Nitrite: NEGATIVE
Protein, ur: 100 mg/dL — AB
Specific Gravity, Urine: 1.037 — ABNORMAL HIGH (ref 1.005–1.030)
pH: 5 (ref 5.0–8.0)

## 2023-03-25 LAB — CBC
HCT: 47.3 % — ABNORMAL HIGH (ref 36.0–46.0)
Hemoglobin: 16.2 g/dL — ABNORMAL HIGH (ref 12.0–15.0)
MCH: 28.7 pg (ref 26.0–34.0)
MCHC: 34.2 g/dL (ref 30.0–36.0)
MCV: 83.9 fL (ref 80.0–100.0)
Platelets: 302 10*3/uL (ref 150–400)
RBC: 5.64 MIL/uL — ABNORMAL HIGH (ref 3.87–5.11)
RDW: 11.9 % (ref 11.5–15.5)
WBC: 8.8 10*3/uL (ref 4.0–10.5)
nRBC: 0 % (ref 0.0–0.2)

## 2023-03-25 LAB — POC URINE PREG, ED: Preg Test, Ur: NEGATIVE

## 2023-03-25 LAB — CBG MONITORING, ED: Glucose-Capillary: 306 mg/dL — ABNORMAL HIGH (ref 70–99)

## 2023-03-25 MED ORDER — CEPHALEXIN 500 MG PO CAPS: 500.0000 mg | ORAL_CAPSULE | Freq: Two times a day (BID) | ORAL | 0 refills | Status: DC

## 2023-03-25 MED ORDER — LIDOCAINE VISCOUS HCL 2 % MT SOLN
15.0000 mL | Freq: Once | OROMUCOSAL | Status: AC
  Administered 2023-03-25: 15 mL via OROMUCOSAL
  Filled 2023-03-25: qty 15

## 2023-03-25 MED ORDER — METFORMIN HCL 500 MG PO TABS: 500.0000 mg | ORAL_TABLET | Freq: Two times a day (BID) | ORAL | 1 refills | Status: DC

## 2023-03-25 MED ORDER — LIDOCAINE VISCOUS HCL 2 % MT SOLN: 15.0000 mL | OROMUCOSAL | 0 refills | Status: AC | PRN

## 2023-03-25 NOTE — ED Provider Notes (Signed)
[facility] Provider Note    Event Date/Time   First MD Initiated Contact with Patient 03/25/23 0900     (approximate)   History   Pelvic Pain   HPI  Karen Johnson is a 22 y.o. female who was recently diagnosed with presumed genital herpes presents with complaints of pain and burning.  Patient reports the burning is interfering with her ability to walk because it is so painful.  She has not take anything for this.  She is also concerned because she has been told that she is prediabetic and is concerned her glucose is elevated     Physical Exam   Triage Vital Signs: ED Triage Vitals  Encounter Vitals Group     BP 03/25/23 0835 (!) 130/109     Systolic BP Percentile --      Diastolic BP Percentile --      Pulse Rate 03/25/23 0835 (!) 128     Resp 03/25/23 0835 18     Temp 03/25/23 0835 99 F (37.2 C)     Temp Source 03/25/23 0835 Oral     SpO2 03/25/23 0835 100 %     Weight 03/25/23 0835 89.8 kg (198 lb)     Height 03/25/23 0835 1.753 m (5\' 9" )     Head Circumference --      Peak Flow --      Pain Score 03/25/23 0847 10     Pain Loc --      Pain Education --      Exclude from Growth Chart --     Most recent vital signs: Vitals:   03/25/23 0835 03/25/23 1049  BP: (!) 130/109 (!) 128/90  Pulse: (!) 128 (!) 108  Resp: 18 18  Temp: 99 F (37.2 C)   SpO2: 100%      General: Awake, no distress.  CV:  Good peripheral perfusion.  Resp:  Normal effort.  Abd:  No distention.  Other:  GU exam, Brandi RN chaperone at with me, left vulvar lesions suspicious for herpes   ED Results / Procedures / Treatments   Labs (all labs ordered are listed, but only abnormal results are displayed) Labs Reviewed  CBC - Abnormal; Notable for the following components:      Result Value   RBC 5.64 (*)    Hemoglobin 16.2 (*)    HCT 47.3 (*)    All other components within normal limits  COMPREHENSIVE METABOLIC PANEL - Abnormal; Notable for the following  components:   Sodium 133 (*)    Chloride 97 (*)    Glucose, Bld 278 (*)    Total Protein 9.1 (*)    All other components within normal limits  URINALYSIS, ROUTINE W REFLEX MICROSCOPIC - Abnormal; Notable for the following components:   Color, Urine AMBER (*)    APPearance CLOUDY (*)    Specific Gravity, Urine 1.037 (*)    Glucose, UA >=500 (*)    Ketones, ur 20 (*)    Protein, ur 100 (*)    Leukocytes,Ua SMALL (*)    Bacteria, UA MANY (*)    All other components within normal limits  CBG MONITORING, ED - Abnormal; Notable for the following components:   Glucose-Capillary 306 (*)    All other components within normal limits  POC URINE PREG, ED     EKG     RADIOLOGY     PROCEDURES:  Critical Care performed:   Procedures   MEDICATIONS ORDERED IN ED:  Medications  lidocaine (XYLOCAINE) 2 % viscous mouth solution 15 mL (15 mLs Mouth/Throat Given 03/25/23 1018)     IMPRESSION / MDM / ASSESSMENT AND PLAN / ED COURSE  I reviewed the triage vital signs and the nursing notes. Patient's presentation is most consistent with acute complicated illness / injury requiring diagnostic workup.  Patient presents with symptoms as above.  She is significantly tachycardic this is possibly due to pain however also noted to have elevated glucose, will check labs to ensure no DKA.  Patient given viscous lidocaine for symptom relief of herpetic lesions  Blood work is reassuring, normal anion gap, not consistent with DKA will start the patient on metformin, strongly urged her to have close follow-up with PCP for management of elevated glucose        FINAL CLINICAL IMPRESSION(S) / ED DIAGNOSES   Final diagnoses:  Herpes simplex vulvovaginitis  Type 2 diabetes mellitus without complication, without long-term current use of insulin (HCC)     Rx / DC Orders   ED Discharge Orders          Ordered    Ambulatory Referral to Primary Care (Establish Care)        03/25/23 1023     lidocaine (XYLOCAINE) 2 % solution  As needed        03/25/23 1025    cephALEXin (KEFLEX) 500 MG capsule  2 times daily        03/25/23 1025    metFORMIN (GLUCOPHAGE) 500 MG tablet  2 times daily with meals        03/25/23 1025             Note:  This document was prepared using Dragon voice recognition software and may include unintentional dictation errors.   Jene Every, MD 03/25/23 [phone]

## 2023-03-25 NOTE — ED Triage Notes (Signed)
Pt here with pelvic pain x3 days. Pt having severe pain, pt was here recently for same and was told that she may have herpes. Pt denies NVD.

## 2023-03-25 NOTE — ED Triage Notes (Signed)
Pt also was told that she is prediabetic, A1C was 9.5 at [facility].

## 2023-04-03 ENCOUNTER — Encounter (HOSPITAL_BASED_OUTPATIENT_CLINIC_OR_DEPARTMENT_OTHER): Payer: Self-pay | Admitting: Family Medicine

## 2023-04-03 ENCOUNTER — Ambulatory Visit (INDEPENDENT_AMBULATORY_CARE_PROVIDER_SITE_OTHER): Payer: Managed Care, Other (non HMO) | Admitting: Family Medicine

## 2023-04-03 VITALS — BP 118/72 | HR 103 | Ht 69.0 in | Wt 196.3 lb

## 2023-04-03 DIAGNOSIS — E119 Type 2 diabetes mellitus without complications: Secondary | ICD-10-CM | POA: Insufficient documentation

## 2023-04-03 DIAGNOSIS — E1365 Other specified diabetes mellitus with hyperglycemia: Secondary | ICD-10-CM | POA: Diagnosis not present

## 2023-04-03 DIAGNOSIS — Z118 Encounter for screening for other infectious and parasitic diseases: Secondary | ICD-10-CM | POA: Diagnosis not present

## 2023-04-03 DIAGNOSIS — A6004 Herpesviral vulvovaginitis: Secondary | ICD-10-CM | POA: Diagnosis not present

## 2023-04-03 DIAGNOSIS — A6000 Herpesviral infection of urogenital system, unspecified: Secondary | ICD-10-CM | POA: Insufficient documentation

## 2023-04-03 MED ORDER — BLOOD GLUCOSE MONITORING SUPPL DEVI: 1.0000 | Freq: Three times a day (TID) | 0 refills | Status: DC

## 2023-04-03 MED ORDER — LANCETS MISC. MISC: 1.0000 | Freq: Three times a day (TID) | 0 refills | Status: AC

## 2023-04-03 MED ORDER — LANCET DEVICE MISC: 1.0000 | Freq: Three times a day (TID) | 0 refills | Status: AC

## 2023-04-03 MED ORDER — BLOOD GLUCOSE TEST VI STRP: 1.0000 | ORAL_STRIP | Freq: Three times a day (TID) | 0 refills | Status: DC

## 2023-04-03 MED ORDER — VALACYCLOVIR HCL 500 MG PO TABS: 500.0000 mg | ORAL_TABLET | Freq: Two times a day (BID) | ORAL | 0 refills | Status: DC

## 2023-04-03 NOTE — Assessment & Plan Note (Signed)
We discussed general management considerations going forward including versus prophylactic management.  This is a new diagnosis for patient and she did have treatment of initial episode recently in the emergency department.  She is currently without any symptoms.  At this time, we can proceed initially with plan for episodic management.  I have sent prescription to pharmacy for valacyclovir for patient to have on hand should recurrence occur.  We will monitor progress moving forward and determine if we would need to switch to prophylactic management. We did discuss considerations regarding transmission of genital herpes.  Did discuss that even when there are no active lesions, it is still possible to transmit this infection.  Recommend discussing with sexual partners, utilizing barrier methods to limit transmission risk.

## 2023-04-03 NOTE — Progress Notes (Signed)
New Patient Office Visit  Subjective    Patient ID: Karen Johnson, female    DOB: Mar 29, 2001  Age: 22 y.o. MRN: [id number]  CC:  Chief Complaint  Patient presents with   New Patient (Initial Visit)    New patient was having issues vaginally was told genital herpes is finishing up medication but needed pcp to follow up    HPI Karen D Kilbride presents to establish care Last PCP - has been awhile  Genital herpes: Patient had recent visit to the emergency department where she was diagnosed with genital herpes.  She has questions regarding this new diagnosis including related to transmission, prognosis, management moving forward.  She does not currently have any symptoms at this time.  Diabetes: Patient with history of diabetes, was diagnosed when she was younger and on review of chart, she was seeing an endocrinologist as a child.  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  She was primarily managing with 70/30 insulin mix.  She reports being told that if she did make notable lifestyle changes and work towards weight loss, she would be able to stop insulin.  On review of chart, she has had variable control of diabetes when she was younger and would have A1c ranging from less than 7% to 11% or greater.  She was on varying amounts of total daily insulin and chart indicates highest doses was around 60 units or more.  Last visit with pediatric endocrinologist was at least 4 to 6 years ago. She has questions at this time due to being told about elevated blood sugars at recent ED visits.  She is not currently utilizing insulin, however was prescribed metformin from emergency department.  Has been taking medication, has some GI upset which has generally improved.  Outpatient Encounter Medications as of 04/03/2023  Medication Sig   Blood Glucose Monitoring Suppl DEVI 1 each by Does not apply route in the morning, at noon, and at bedtime. May substitute to any  manufacturer covered by patient's insurance.   Glucose Blood (BLOOD GLUCOSE TEST STRIPS) STRP 1 each by In Vitro route in the morning, at noon, and at bedtime. May substitute to any manufacturer covered by patient's insurance.   Lancet Device MISC 1 each by Does not apply route in the morning, at noon, and at bedtime. May substitute to any manufacturer covered by patient's insurance.   Lancets Misc. MISC 1 each by Does not apply route in the morning, at noon, and at bedtime. May substitute to any manufacturer covered by patient's insurance.   metFORMIN (GLUCOPHAGE) 500 MG tablet Take 1 tablet (500 mg total) by mouth 2 (two) times daily with a meal.   valACYclovir (VALTREX) 500 MG tablet Take 1 tablet (500 mg total) by mouth 2 (two) times daily.   triamcinolone ointment (KENALOG) 0.1 % Apply 1 application topically 2 (two) times daily. (Patient not taking: Reported on 04/03/2023)   [DISCONTINUED] cephALEXin (KEFLEX) 500 MG capsule Take 1 capsule (500 mg total) by mouth 2 (two) times daily. (Patient not taking: Reported on 04/03/2023)   [DISCONTINUED] famotidine (PEPCID) 20 MG tablet Take 1 tablet (20 mg total) by mouth 2 (two) times daily for 15 days.   [DISCONTINUED] insulin aspart protamine - aspart (NOVOLOG 70/30 MIX) (70-30) 100 UNIT/ML FlexPen Inject 65 Units into the skin 2 (two) times daily. (Patient not taking: Reported on 08/01/2020)   [DISCONTINUED] polyethylene glycol powder (GLYCOLAX/MIRALAX) powder Take 17 g by mouth daily. (Patient  not taking: Reported on 05/26/2019)   [DISCONTINUED] Prenatal Vit-Fe Fumarate-FA (PRENATAL VITAMINS) 28-0.8 MG TABS Take 1 tablet by mouth daily. (Patient not taking: Reported on 10/21/2019)   No facility-administered encounter medications on file as of 04/03/2023.    Past Medical History:  Diagnosis Date   Diabetes mellitus without complication (HCC)    Pancreatitis    Pancreatitis     Past Surgical History:  Procedure Laterality Date   denies surgical  hx      History reviewed. No pertinent family history.  Social History   Socioeconomic History   Marital status: Single    Spouse name: Not on file   Number of children: Not on file   Years of education: Not on file   Highest education level: GED or equivalent  Occupational History   Not on file  Tobacco Use   Smoking status: Every Day    Current packs/day: 0.00    Average packs/day: 2.0 packs/day for 3.0 years (6.0 ttl pk-yrs)    Types: E-cigarettes, Cigarettes    Start date: 06/26/2017    Last attempt to quit: 06/26/2020    Years since quitting: 2.7   Smokeless tobacco: Never  Vaping Use   Vaping status: Never Used  Substance and Sexual Activity   Alcohol use: Yes    Alcohol/week: 7.0 standard drinks of alcohol    Types: 7 Shots of liquor per week    Comment: last use 01/06/23 2x/mo   Drug use: Yes    Types: Marijuana    Comment: last use 02/2022   Sexual activity: Yes    Partners: Male    Birth control/protection: None  Other Topics Concern   Not on file  Social History Narrative   Not on file   Social Determinants of Health   Financial Resource Strain: High Risk (03/30/2023)   Overall Financial Resource Strain (CARDIA)    Difficulty of Paying Living Expenses: Hard  Food Insecurity: Food Insecurity Present (03/30/2023)   Hunger Vital Sign    Worried About Running Out of Food in the Last Year: Never true    Ran Out of Food in the Last Year: Sometimes true  Transportation Needs: No Transportation Needs (03/30/2023)   PRAPARE - Administrator, Civil Service (Medical): No    Lack of Transportation (Non-Medical): No  Physical Activity: Insufficiently Active (03/30/2023)   Exercise Vital Sign    Days of Exercise per Week: 1 day    Minutes of Exercise per Session: 10 min  Stress: No Stress Concern Present (03/30/2023)   [employer] of Occupational Health - Occupational Stress Questionnaire    Feeling of Stress : Only a little  Social Connections:  Moderately Integrated (03/30/2023)   Social Connection and Isolation Panel [NHANES]    Frequency of Communication with Friends and Family: More than three times a week    Frequency of Social Gatherings with Friends and Family: Once a week    Attends Religious Services: 1 to 4 times per year    Active Member of [employer] or Organizations: No    Attends Engineer, structural: Not on file    Marital Status: Living with partner  Intimate Partner Violence: Not At Risk (11/30/2020)   Humiliation, Afraid, Rape, and Kick questionnaire    Fear of Current or Ex-Partner: No    Emotionally Abused: No    Physically Abused: No    Sexually Abused: No    Objective    BP 118/72 (BP Location: Right Arm, Patient  Position: Sitting, Cuff Size: Normal)   Pulse (!) 103   Ht 5\' 9"  (1.753 m)   Wt 196 lb 4.8 oz (89 kg)   LMP 03/09/2023 (Exact Date)   SpO2 99%   BMI 28.99 kg/m   Physical Exam  22 year old female in no acute distress Cardiovascular Sam with regular rate and rhythm Lungs clear to auscultation bilaterally  Assessment & Plan:   Encounter for screening examination for chlamydial infection -     Chlamydia/GC NAA, Confirmation  Other specified diabetes mellitus with hyperglycemia, unspecified whether long term insulin use (HCC) Assessment & Plan: Unclear diagnosis of type I versus type II etiology.  Prior evaluation with endocrinology as a child indicates that at that time it was being managed as a mix of type I/type II.  She is currently only utilizing metformin, can continue with this at the present time Will check hemoglobin A1c today as well as complete nephropathy screening Likely we will plan to initiate long-acting insulin therapy once we receive hemoglobin A1c result.  Discussed that this is typically administered once a day with regular monitoring of fasting blood sugar to assist with adjusting dose of insulin. We will plan to complete foot exam at upcoming office visit, we will  need to ensure that she has follow-up with eye doctor for retinopathy screening  Orders: -     Hemoglobin A1c -     Microalbumin / creatinine urine ratio -     Ambulatory referral to Endocrinology -     Blood Glucose Monitoring Suppl; 1 each by Does not apply route in the morning, at noon, and at bedtime. May substitute to any manufacturer covered by patient's insurance.  Dispense: 1 each; Refill: 0 -     Blood Glucose Test; 1 each by In Vitro route in the morning, at noon, and at bedtime. May substitute to any manufacturer covered by patient's insurance.  Dispense: 100 strip; Refill: 0 -     Lancet Device; 1 each by Does not apply route in the morning, at noon, and at bedtime. May substitute to any manufacturer covered by patient's insurance.  Dispense: 1 each; Refill: 0 -     Lancets Misc.; 1 each by Does not apply route in the morning, at noon, and at bedtime. May substitute to any manufacturer covered by patient's insurance.  Dispense: 100 each; Refill: 0  Herpes simplex vulvovaginitis Assessment & Plan: We discussed general management considerations going forward including versus prophylactic management.  This is a new diagnosis for patient and she did have treatment of initial episode recently in the emergency department.  She is currently without any symptoms.  At this time, we can proceed initially with plan for episodic management.  I have sent prescription to pharmacy for valacyclovir for patient to have on hand should recurrence occur.  We will monitor progress moving forward and determine if we would need to switch to prophylactic management. We did discuss considerations regarding transmission of genital herpes.  Did discuss that even when there are no active lesions, it is still possible to transmit this infection.  Recommend discussing with sexual partners, utilizing barrier methods to limit transmission risk.   Other orders -     valACYclovir HCl; Take 1 tablet (500 mg total) by  mouth 2 (two) times daily.  Dispense: 6 tablet; Refill: 0  Return in about 4 weeks (around 05/01/2023).   Spent 52 minutes on this patient encounter, including preparation, chart review, face-to-face counseling with patient and coordination of care,  and documentation of encounter   ___________________________________________ Pearlina Friedly de [place], MD, ABFM, [facility] Primary Care and Sports Medicine [facility]

## 2023-04-03 NOTE — Assessment & Plan Note (Signed)
Unclear diagnosis of type I versus type II etiology.  Prior evaluation with endocrinology as a child indicates that at that time it was being managed as a mix of type I/type II.  She is currently only utilizing metformin, can continue with this at the present time Will check hemoglobin A1c today as well as complete nephropathy screening Likely we will plan to initiate long-acting insulin therapy once we receive hemoglobin A1c result.  Discussed that this is typically administered once a day with regular monitoring of fasting blood sugar to assist with adjusting dose of insulin. We will plan to complete foot exam at upcoming office visit, we will need to ensure that she has follow-up with eye doctor for retinopathy screening

## 2023-04-04 LAB — HEMOGLOBIN A1C
Est. average glucose Bld gHb Est-mCnc: 206 mg/dL
Hgb A1c MFr Bld: 8.8 % — ABNORMAL HIGH (ref 4.8–5.6)

## 2023-04-04 LAB — MICROALBUMIN / CREATININE URINE RATIO
Creatinine, Urine: 82.9 mg/dL
Microalb/Creat Ratio: 8 mg/g{creat} (ref 0–29)
Microalbumin, Urine: 6.4 ug/mL

## 2023-04-07 LAB — CHLAMYDIA/GC NAA, CONFIRMATION
Chlamydia trachomatis, NAA: NEGATIVE
Neisseria gonorrhoeae, NAA: NEGATIVE

## 2023-05-01 ENCOUNTER — Ambulatory Visit (HOSPITAL_BASED_OUTPATIENT_CLINIC_OR_DEPARTMENT_OTHER): Payer: Managed Care, Other (non HMO) | Admitting: Family Medicine

## 2023-05-01 ENCOUNTER — Encounter (HOSPITAL_BASED_OUTPATIENT_CLINIC_OR_DEPARTMENT_OTHER): Payer: Self-pay

## 2023-05-09 ENCOUNTER — Ambulatory Visit (HOSPITAL_BASED_OUTPATIENT_CLINIC_OR_DEPARTMENT_OTHER): Payer: Managed Care, Other (non HMO) | Admitting: Family Medicine

## 2023-05-09 ENCOUNTER — Encounter (HOSPITAL_BASED_OUTPATIENT_CLINIC_OR_DEPARTMENT_OTHER): Payer: Self-pay

## 2023-05-14 ENCOUNTER — Other Ambulatory Visit (HOSPITAL_BASED_OUTPATIENT_CLINIC_OR_DEPARTMENT_OTHER): Payer: Self-pay | Admitting: Family Medicine

## 2023-05-14 DIAGNOSIS — E1365 Other specified diabetes mellitus with hyperglycemia: Secondary | ICD-10-CM

## 2023-05-30 ENCOUNTER — Telehealth: Payer: Self-pay

## 2023-05-30 NOTE — Telephone Encounter (Signed)
Copied from CRM [id number]. Topic: Clinical - Prescription Issue >> May 28, 2023  2:57 PM Fonda Kinder J wrote: Reason for CRM: Patient went to pick up rx refill for valACYclovir & was told the medication had not been authorized by PCP, patient is asking for clinic to fill and return refill request that was faxed over by pharmacy

## 2023-06-02 ENCOUNTER — Other Ambulatory Visit (HOSPITAL_BASED_OUTPATIENT_CLINIC_OR_DEPARTMENT_OTHER): Payer: Self-pay | Admitting: Family Medicine

## 2023-06-02 DIAGNOSIS — E1365 Other specified diabetes mellitus with hyperglycemia: Secondary | ICD-10-CM

## 2023-06-02 MED ORDER — BLOOD GLUCOSE MONITORING SUPPL DEVI: 1.0000 | Freq: Three times a day (TID) | 0 refills | Status: AC

## 2023-06-02 MED ORDER — VALACYCLOVIR HCL 500 MG PO TABS: 500.0000 mg | ORAL_TABLET | Freq: Two times a day (BID) | ORAL | 0 refills | Status: DC

## 2023-06-02 MED ORDER — BLOOD GLUCOSE TEST VI STRP: 1.0000 | ORAL_STRIP | Freq: Three times a day (TID) | 0 refills | Status: AC

## 2023-06-03 ENCOUNTER — Encounter (HOSPITAL_BASED_OUTPATIENT_CLINIC_OR_DEPARTMENT_OTHER): Payer: Self-pay | Admitting: *Deleted

## 2023-06-03 NOTE — Telephone Encounter (Signed)
My chart message sent to pt to schedule appt.

## 2023-06-10 ENCOUNTER — Encounter (HOSPITAL_BASED_OUTPATIENT_CLINIC_OR_DEPARTMENT_OTHER): Payer: Self-pay

## 2023-06-23 ENCOUNTER — Other Ambulatory Visit: Payer: Self-pay

## 2023-06-23 DIAGNOSIS — E1365 Other specified diabetes mellitus with hyperglycemia: Secondary | ICD-10-CM

## 2023-06-24 ENCOUNTER — Other Ambulatory Visit: Payer: Managed Care, Other (non HMO)

## 2023-06-27 ENCOUNTER — Ambulatory Visit: Payer: Managed Care, Other (non HMO) | Admitting: "Endocrinology

## 2023-07-03 ENCOUNTER — Ambulatory Visit (HOSPITAL_BASED_OUTPATIENT_CLINIC_OR_DEPARTMENT_OTHER): Payer: Managed Care, Other (non HMO) | Admitting: Family Medicine

## 2023-07-03 ENCOUNTER — Encounter (HOSPITAL_BASED_OUTPATIENT_CLINIC_OR_DEPARTMENT_OTHER): Payer: Self-pay | Admitting: Family Medicine

## 2023-07-03 VITALS — BP 112/80 | HR 79 | Ht 70.0 in | Wt 197.1 lb

## 2023-07-03 DIAGNOSIS — E1365 Other specified diabetes mellitus with hyperglycemia: Secondary | ICD-10-CM

## 2023-07-03 DIAGNOSIS — A6004 Herpesviral vulvovaginitis: Secondary | ICD-10-CM | POA: Diagnosis not present

## 2023-07-03 MED ORDER — VALACYCLOVIR HCL 500 MG PO TABS: 500.0000 mg | ORAL_TABLET | Freq: Two times a day (BID) | ORAL | 1 refills | Status: DC

## 2023-07-03 MED ORDER — INSULIN GLARGINE 100 UNIT/ML SOLOSTAR PEN
10.0000 [IU] | PEN_INJECTOR | Freq: Every day | SUBCUTANEOUS | 1 refills | Status: DC
  Filled 2023-07-09: qty 15, 150d supply, fill #0

## 2023-07-03 NOTE — Patient Instructions (Signed)
  Medication Instructions:  Your physician recommends that you continue on your current medications as directed. Please refer to the Current Medication list given to you today. --If you need a refill on any your medications before your next appointment, please call your pharmacy first. If no refills are authorized on file call the office.--   Follow-Up: Your next appointment:   Your physician recommends that you schedule a follow-up appointment in: 4-6 week follow up with Dr. de Peru  You will receive a text message or e-mail with a link to a survey about your care and experience with [place] today! We would greatly appreciate your feedback!   Thanks for letting us be apart of your health journey!!  Primary Care and Sports Medicine   Dr. Ceasar Mons [place]   We encourage you to activate your patient portal called "MyChart".  Sign up information is provided on this After Visit Summary.  MyChart is used to connect with patients for Virtual Visits (Telemedicine).  Patients are able to view lab/test results, encounter notes, upcoming appointments, etc.  Non-urgent messages can be sent to your provider as well. To learn more about what you can do with MyChart, please visit --  [email or web address].

## 2023-07-03 NOTE — Progress Notes (Signed)
    Procedures performed today:    None.  Independent interpretation of notes and tests performed by another provider:   None.  Brief History, Exam, Impression, and Recommendations:    BP 112/80 (BP Location: Right Arm, Patient Position: Sitting, Cuff Size: Normal)   Pulse 79   Ht 5' 10 (1.778 m)   Wt 197 lb 1.6 oz (89.4 kg)   SpO2 100%   BMI 28.28 kg/m   Other specified diabetes mellitus with hyperglycemia, unspecified whether long term insulin  use (HCC) Assessment & Plan: Unclear diagnosis of type I versus type II etiology.  Prior evaluation with endocrinology as a child indicates that at that time it was being managed as a mix of type I/type II.  She is currently only utilizing metformin , but is interested in switching to long-acting insulin  for management Prior hemoglobin A1c elevated at 8.8%.  Discussed goal of maintaining insulin  less than 7%.  We will plan to complete foot exam at upcoming office visit, we will need to ensure that she has follow-up with eye doctor for retinopathy screening  Orders: -     Ambulatory referral to Ophthalmology -     Pen Needles; 1 each by Does not apply route daily.  Dispense: 100 each; Refill: 1  Herpes simplex vulvovaginitis Assessment & Plan: Continues with episodic management.  Requesting refill of medication today.  Medication sent to pharmacy.  Again reviewed episodic versus prophylactic management.  Flares are infrequent enough that she would prefer to continue with episodic which is reasonable   Other orders -     valACYclovir  HCl; Take 1 tablet (500 mg total) by mouth 2 (two) times daily.  Dispense: 6 tablet; Refill: 1  Return in about 6 weeks (around 08/14/2023) for diabetes.   ___________________________________________ Shields Pautz de [place], MD, ABFM, CAQSM Primary Care and Sports Medicine [facility]

## 2023-07-09 ENCOUNTER — Other Ambulatory Visit (HOSPITAL_BASED_OUTPATIENT_CLINIC_OR_DEPARTMENT_OTHER): Payer: Self-pay

## 2023-07-17 ENCOUNTER — Ambulatory Visit (HOSPITAL_BASED_OUTPATIENT_CLINIC_OR_DEPARTMENT_OTHER): Payer: Managed Care, Other (non HMO) | Admitting: Family Medicine

## 2023-07-18 ENCOUNTER — Ambulatory Visit (HOSPITAL_BASED_OUTPATIENT_CLINIC_OR_DEPARTMENT_OTHER): Payer: Managed Care, Other (non HMO) | Admitting: Family Medicine

## 2023-07-21 ENCOUNTER — Telehealth (HOSPITAL_BASED_OUTPATIENT_CLINIC_OR_DEPARTMENT_OTHER): Payer: Self-pay | Admitting: *Deleted

## 2023-07-21 ENCOUNTER — Other Ambulatory Visit (HOSPITAL_BASED_OUTPATIENT_CLINIC_OR_DEPARTMENT_OTHER): Payer: Self-pay | Admitting: *Deleted

## 2023-07-21 ENCOUNTER — Other Ambulatory Visit (HOSPITAL_BASED_OUTPATIENT_CLINIC_OR_DEPARTMENT_OTHER): Payer: Self-pay

## 2023-07-21 DIAGNOSIS — E1365 Other specified diabetes mellitus with hyperglycemia: Secondary | ICD-10-CM

## 2023-07-21 MED ORDER — INSULIN GLARGINE 100 UNIT/ML SOLOSTAR PEN
10.0000 [IU] | PEN_INJECTOR | Freq: Every day | SUBCUTANEOUS | 1 refills | Status: DC
  Filled 2023-07-21: qty 9, 90d supply, fill #0
  Filled 2023-09-24 – 2023-09-29 (×4): qty 9, 90d supply, fill #1

## 2023-07-21 NOTE — Telephone Encounter (Signed)
Spoke with pt about sending in insulin to [facility].   Pt is agreeable. Sent medcation down stairs will notify us of any issues

## 2023-07-21 NOTE — Telephone Encounter (Signed)
Copied from CRM [id number]. Topic: General - Other >> Jul 21, 2023  1:58 PM Prudencio Pair wrote: Reason for CRM: Patient states she was told by Edson Snowball to give her a call so that they can discuss her insulin. Tried calling [facility] twice & no answer. Pt states she was also told there is no working number in her file. Patient provided this number to give a call back on. CB #: [phone].

## 2023-07-23 ENCOUNTER — Encounter (HOSPITAL_BASED_OUTPATIENT_CLINIC_OR_DEPARTMENT_OTHER): Payer: Self-pay

## 2023-07-24 ENCOUNTER — Other Ambulatory Visit (HOSPITAL_BASED_OUTPATIENT_CLINIC_OR_DEPARTMENT_OTHER): Payer: Self-pay

## 2023-07-24 MED ORDER — PEN NEEDLES 31G X 6 MM MISC
1.0000 | Freq: Every day | 1 refills | Status: AC
  Filled 2023-07-24 – 2023-08-19 (×2): qty 100, 30d supply, fill #0
  Filled 2023-09-18 – 2024-02-25 (×3): qty 100, 30d supply, fill #1

## 2023-07-24 NOTE — Assessment & Plan Note (Signed)
Unclear diagnosis of type I versus type II etiology.  Prior evaluation with endocrinology as a child indicates that at that time it was being managed as a mix of type I/type II.  She is currently only utilizing metformin, but is interested in switching to long-acting insulin for management Prior hemoglobin A1c elevated at 8.8%.  Discussed goal of maintaining insulin less than 7%.  We will plan to complete foot exam at upcoming office visit, we will need to ensure that she has follow-up with eye doctor for retinopathy screening

## 2023-07-24 NOTE — Assessment & Plan Note (Signed)
Continues with episodic management.  Requesting refill of medication today.  Medication sent to pharmacy.  Again reviewed episodic versus prophylactic management.  Flares are infrequent enough that she would prefer to continue with episodic which is reasonable

## 2023-07-31 ENCOUNTER — Ambulatory Visit (HOSPITAL_BASED_OUTPATIENT_CLINIC_OR_DEPARTMENT_OTHER): Payer: Managed Care, Other (non HMO) | Admitting: Family Medicine

## 2023-08-04 ENCOUNTER — Other Ambulatory Visit (HOSPITAL_BASED_OUTPATIENT_CLINIC_OR_DEPARTMENT_OTHER): Payer: Self-pay

## 2023-08-12 ENCOUNTER — Other Ambulatory Visit (HOSPITAL_BASED_OUTPATIENT_CLINIC_OR_DEPARTMENT_OTHER): Payer: Self-pay | Admitting: *Deleted

## 2023-08-12 MED ORDER — VALACYCLOVIR HCL 500 MG PO TABS: 500.0000 mg | ORAL_TABLET | Freq: Two times a day (BID) | ORAL | 1 refills | Status: DC

## 2023-08-19 ENCOUNTER — Ambulatory Visit (INDEPENDENT_AMBULATORY_CARE_PROVIDER_SITE_OTHER): Payer: Managed Care, Other (non HMO) | Admitting: Family Medicine

## 2023-08-19 ENCOUNTER — Encounter (HOSPITAL_BASED_OUTPATIENT_CLINIC_OR_DEPARTMENT_OTHER): Payer: Self-pay | Admitting: Family Medicine

## 2023-08-19 ENCOUNTER — Other Ambulatory Visit (HOSPITAL_BASED_OUTPATIENT_CLINIC_OR_DEPARTMENT_OTHER): Payer: Self-pay

## 2023-08-19 ENCOUNTER — Other Ambulatory Visit (HOSPITAL_COMMUNITY)
Admission: RE | Admit: 2023-08-19 | Discharge: 2023-08-19 | Disposition: A | Discharge status: Home / Self Care | Source: Ambulatory Visit | Attending: Family Medicine | Admitting: Family Medicine

## 2023-08-19 VITALS — BP 110/75 | HR 92 | Ht 70.0 in | Wt 202.9 lb

## 2023-08-19 DIAGNOSIS — L732 Hidradenitis suppurativa: Secondary | ICD-10-CM

## 2023-08-19 DIAGNOSIS — Z124 Encounter for screening for malignant neoplasm of cervix: Secondary | ICD-10-CM | POA: Insufficient documentation

## 2023-08-19 MED ORDER — CLINDAMYCIN PHOSPHATE 1 % EX GEL: CUTANEOUS | 1 refills | Status: DC

## 2023-08-19 NOTE — Patient Instructions (Signed)
 Topical wash Hibiclens - 1-2x per month

## 2023-08-19 NOTE — Progress Notes (Signed)
 Subjective:   Karen Johnson [birth year] 08/19/2023  Chief Complaint  Patient presents with   Medical Management of Chronic Issues    Patient is here today to have her pap smear performed. Denies any concerns for today.    HPI: Karen D Worthley is a 23 year old female with history of obesity, diabetes, and genital herpes who presents today for pap smear and concern of recurring skin lesions.  RASH:  Patient reports chronic occurrence of painful blistering lesions present to axilla, groin, breast area, and genital folds for several years.  Patient has tried topical therapies that are over-the-counter without relief.  She reports these areas are painful, blistering, and at times have drainage.  She states that she can feel there are 2 painful areas to the buttock area without drainage, redness, or warmth.  She denies fevers or chills.  She does have a history of hidradenitis suppurativa per chart review   The following portions of the patient's history were reviewed and updated as appropriate: past medical history, past surgical history, family history, social history, allergies, medications, and problem list.   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 No pertinent family history. Outpatient Medications Prior to Visit  Medication Sig Dispense Refill   Blood Glucose Monitoring Suppl DEVI 1 each by Does not apply route in the morning, at noon, and at bedtime. May substitute to any manufacturer covered by patient's insurance. 1 each 0   Glucose  Blood (BLOOD GLUCOSE TEST STRIPS) STRP 1 each by In Vitro route in the morning, at noon, and at bedtime. May substitute to any manufacturer covered by patient's insurance. 100 strip 0   insulin glargine (LANTUS) 100 UNIT/ML Solostar Pen Inject 10 Units into the skin daily. 15 mL 1   Insulin Pen Needle (PEN NEEDLES) 31G X 6 MM MISC 1 each by Does not apply route daily. 100 each 1   triamcinolone ointment (KENALOG) 0.1 % Apply 1 application topically 2 (two) times daily. 30 g 1   valACYclovir (VALTREX) 500 MG tablet Take 1 tablet (500 mg total) by mouth 2 (two) times daily. 6 tablet 1   No facility-administered medications prior to visit.   No Known Allergies   ROS: A complete ROS was performed with pertinent positives/negatives noted in the HPI. The remainder of the ROS are negative.    Objective:   Today's Vitals   08/19/23 1431  BP: 110/75  Pulse: 92  SpO2: 100%  Weight: 202 lb 14.4 oz (92 kg)  Height: 5\' 10"  (1.778 m)    Physical Exam          GENERAL: Well-appearing, in NAD. Well nourished.  SKIN: Pink, warm and dry.  Small induration approximately 1 cm present to bilateral buttocks without erythema or drainage or punctate center. Head: Normocephalic. NECK: Trachea midline. Full ROM w/o pain or tenderness. RESPIRATORY: Chest wall symmetrical. Respirations even and non-labored. Breath sounds clear to auscultation bilaterally.  CARDIAC: S1, S2 present, regular rate and rhythm without murmur or gallops. Peripheral pulses 2+ bilaterally.  MSK: Muscle tone and strength appropriate for age.  GU: External genitalia without erythema, lesions, or masses. No lymphadenopathy. Vaginal mucosa pink and moist without exudate, lesions, or ulcerations. Cervix pink without discharge. Cervical os closed. Uterus and adnexae palpable, not enlarged, and w/o tenderness. No palpable masses. Chaperoned by Tamela Oddi May, CMA.  EXTREMITIES: Without clubbing, cyanosis, or edema.  NEUROLOGIC: No motor or  sensory deficits. Steady, even gait. C2-C12 intact.  PSYCH/MENTAL STATUS: Alert, oriented x 3. Cooperative, appropriate mood and affect.     Assessment & Plan:  1. Encounter for Papanicolaou smear for cervical cancer screening (Primary) Pap completed and patient tolerated well.  Will notify of results when available.  STD added on with Pap per patient request.  She is currently asymptomatic and denies known exposure. - Cytology - PAP  2. Hidradenitis suppurativa Discussed triggering factors for at bedtime and ways to prevent recurrence.  Recommend she start using Hibiclens 1-2 times a month for bacterial control and start using clindamycin gel twice daily to affected induration areas.  If no improvement in 2 to 4 weeks, reach out to PCP and would recommend a referral to dermatology. - clindamycin (CLINDAGEL) 1 % gel; Apply topically twice daily as needed for abscess formation.  Dispense: 30 g; Refill: 1   Meds ordered this encounter  Medications   clindamycin (CLINDAGEL) 1 % gel    Sig: Apply topically twice daily as needed for abscess formation.    Dispense:  30 g    Refill:  1    Supervising Provider:   DE [place], RAYMOND J [[id number]]   Return if symptoms worsen or fail to improve.    Patient to reach out to office if new, worrisome, or unresolved symptoms arise or if no improvement in patient's condition. Patient verbalized understanding and is agreeable to treatment plan. All questions answered to patient's satisfaction.    Hilbert Bible, [place]

## 2023-08-25 ENCOUNTER — Encounter (HOSPITAL_BASED_OUTPATIENT_CLINIC_OR_DEPARTMENT_OTHER): Payer: Self-pay | Admitting: Family Medicine

## 2023-08-25 LAB — CYTOLOGY - PAP
Chlamydia: NEGATIVE
Comment: NEGATIVE
Comment: NEGATIVE
Comment: NORMAL
Neisseria Gonorrhea: NEGATIVE
Trichomonas: NEGATIVE

## 2023-08-25 NOTE — Progress Notes (Signed)
 Hi [place], Your Pap smear came back with low-grade squamous intraepithelial lesion.  This indicates mild changes or inflammation to the tissue around your cervix and can be due to the HPV virus.  These lesions will likely resolve on their own.  According to the cytology guidelines, based upon your age we can plan to repeat this in 1 year with a Pap smear.  There is no other screening or testing that we need to do at this time.  If you have any further questions please let me know

## 2023-09-11 ENCOUNTER — Ambulatory Visit (HOSPITAL_BASED_OUTPATIENT_CLINIC_OR_DEPARTMENT_OTHER): Payer: Managed Care, Other (non HMO) | Admitting: Family Medicine

## 2023-09-18 ENCOUNTER — Other Ambulatory Visit (HOSPITAL_BASED_OUTPATIENT_CLINIC_OR_DEPARTMENT_OTHER): Payer: Self-pay

## 2023-09-24 ENCOUNTER — Other Ambulatory Visit (HOSPITAL_BASED_OUTPATIENT_CLINIC_OR_DEPARTMENT_OTHER): Payer: Self-pay

## 2023-09-24 ENCOUNTER — Other Ambulatory Visit (HOSPITAL_COMMUNITY): Payer: Self-pay

## 2023-09-27 ENCOUNTER — Other Ambulatory Visit (HOSPITAL_BASED_OUTPATIENT_CLINIC_OR_DEPARTMENT_OTHER): Payer: Self-pay

## 2023-09-29 ENCOUNTER — Other Ambulatory Visit (HOSPITAL_BASED_OUTPATIENT_CLINIC_OR_DEPARTMENT_OTHER): Payer: Self-pay

## 2023-09-29 ENCOUNTER — Other Ambulatory Visit (HOSPITAL_BASED_OUTPATIENT_CLINIC_OR_DEPARTMENT_OTHER): Payer: Self-pay | Admitting: Family Medicine

## 2023-10-02 ENCOUNTER — Other Ambulatory Visit (HOSPITAL_BASED_OUTPATIENT_CLINIC_OR_DEPARTMENT_OTHER): Payer: Self-pay | Admitting: Family Medicine

## 2023-10-02 ENCOUNTER — Other Ambulatory Visit (HOSPITAL_BASED_OUTPATIENT_CLINIC_OR_DEPARTMENT_OTHER): Payer: Self-pay

## 2023-10-02 DIAGNOSIS — E1365 Other specified diabetes mellitus with hyperglycemia: Secondary | ICD-10-CM

## 2023-10-02 MED ORDER — INSULIN GLARGINE 100 UNIT/ML SOLOSTAR PEN
10.0000 [IU] | PEN_INJECTOR | Freq: Every day | SUBCUTANEOUS | 1 refills | Status: DC
  Filled 2023-10-02: qty 15, 150d supply, fill #0

## 2023-10-02 NOTE — Telephone Encounter (Signed)
 Copied from CRM [id number]. Topic: Clinical - Medication Refill >> Oct 02, 2023 10:20 AM Shelah Lewandowsky wrote: Most Recent Primary Care Visit:  Provider: Hilbert Bible  Department: [facility]  Visit Type: OFFICE VISIT  Date: 08/19/2023  Medication: insulin glargine (LANTUS) 100 UNIT/ML Solostar Pen  Has the patient contacted their pharmacy? Yes (Agent: If no, request that the patient contact the pharmacy for the refill. If patient does not wish to contact the pharmacy document the reason why and proceed with request.) (Agent: If yes, when and what did the pharmacy advise?)  Is this the correct pharmacy for this prescription? Yes If no, delete pharmacy and type the correct one.  This is the patient's preferred pharmacy:    [facility] [address] [place] [address] Phone: [phone] Fax: [phone]   Has the prescription been filled recently? Yes  Is the patient out of the medication? Yes  Has the patient been seen for an appointment in the last year OR does the patient have an upcoming appointment? Yes  Can we respond through MyChart? Yes  Agent: Please be advised that Rx refills may take up to 3 business days. We ask that you follow-up with your pharmacy.

## 2023-11-13 ENCOUNTER — Emergency Department

## 2023-11-13 ENCOUNTER — Encounter: Payer: Self-pay | Admitting: Emergency Medicine

## 2023-11-13 ENCOUNTER — Other Ambulatory Visit: Payer: Self-pay

## 2023-11-13 ENCOUNTER — Emergency Department
Admission: EM | Admit: 2023-11-13 | Discharge: 2023-11-13 | Disposition: A | Discharge status: Home / Self Care | Attending: Emergency Medicine | Admitting: Emergency Medicine

## 2023-11-13 DIAGNOSIS — D72829 Elevated white blood cell count, unspecified: Secondary | ICD-10-CM | POA: Insufficient documentation

## 2023-11-13 DIAGNOSIS — K2920 Alcoholic gastritis without bleeding: Secondary | ICD-10-CM | POA: Insufficient documentation

## 2023-11-13 DIAGNOSIS — R109 Unspecified abdominal pain: Secondary | ICD-10-CM | POA: Diagnosis not present

## 2023-11-13 DIAGNOSIS — E119 Type 2 diabetes mellitus without complications: Secondary | ICD-10-CM | POA: Diagnosis not present

## 2023-11-13 DIAGNOSIS — R112 Nausea with vomiting, unspecified: Secondary | ICD-10-CM | POA: Diagnosis present

## 2023-11-13 DIAGNOSIS — R079 Chest pain, unspecified: Secondary | ICD-10-CM | POA: Diagnosis not present

## 2023-11-13 LAB — COMPREHENSIVE METABOLIC PANEL WITH GFR
ALT: 16 U/L (ref 0–44)
AST: 21 U/L (ref 15–41)
Albumin: 4.7 g/dL (ref 3.5–5.0)
Alkaline Phosphatase: 31 U/L — ABNORMAL LOW (ref 38–126)
Anion gap: 15 (ref 5–15)
BUN: 14 mg/dL (ref 6–20)
CO2: 28 mmol/L (ref 22–32)
Calcium: 9.4 mg/dL (ref 8.9–10.3)
Chloride: 93 mmol/L — ABNORMAL LOW (ref 98–111)
Creatinine, Ser: 0.84 mg/dL (ref 0.44–1.00)
GFR, Estimated: >60 mL/min (ref >60)
Glucose, Bld: 274 mg/dL — ABNORMAL HIGH (ref 70–99)
Potassium: 3.3 mmol/L — ABNORMAL LOW (ref 3.5–5.1)
Sodium: 136 mmol/L (ref 135–145)
Total Bilirubin: 1.2 mg/dL (ref 0.0–1.2)
Total Protein: 8.4 g/dL — ABNORMAL HIGH (ref 6.5–8.1)

## 2023-11-13 LAB — CBC
HCT: 39.4 % (ref 36.0–46.0)
Hemoglobin: 14.2 g/dL (ref 12.0–15.0)
MCH: 29.7 pg (ref 26.0–34.0)
MCHC: 36 g/dL (ref 30.0–36.0)
MCV: 82.4 fL (ref 80.0–100.0)
Platelets: 314 10*3/uL (ref 150–400)
RBC: 4.78 MIL/uL (ref 3.87–5.11)
RDW: 11.8 % (ref 11.5–15.5)
WBC: 17.7 10*3/uL — ABNORMAL HIGH (ref 4.0–10.5)
nRBC: 0 % (ref 0.0–0.2)

## 2023-11-13 LAB — POC URINE PREG, ED: Preg Test, Ur: NEGATIVE

## 2023-11-13 LAB — URINALYSIS, ROUTINE W REFLEX MICROSCOPIC
Bacteria, UA: NONE SEEN
Bilirubin Urine: NEGATIVE
Glucose, UA: >=500 mg/dL — AB
Hgb urine dipstick: NEGATIVE
Ketones, ur: 80 mg/dL — AB
Leukocytes,Ua: NEGATIVE
Nitrite: NEGATIVE
Protein, ur: 30 mg/dL — AB
Specific Gravity, Urine: 1.027 (ref 1.005–1.030)
pH: 6 (ref 5.0–8.0)

## 2023-11-13 LAB — BETA-HYDROXYBUTYRIC ACID: Beta-Hydroxybutyric Acid: 1.05 mmol/L — ABNORMAL HIGH (ref 0.05–0.27)

## 2023-11-13 LAB — BLOOD GAS, VENOUS
Acid-Base Excess: 11.2 mmol/L — ABNORMAL HIGH (ref 0.0–2.0)
Bicarbonate: 35.9 mmol/L — ABNORMAL HIGH (ref 20.0–28.0)
O2 Saturation: 73.9 %
Patient temperature: 37
pCO2, Ven: 46 mmHg (ref 44–60)
pH, Ven: 7.5 — ABNORMAL HIGH (ref 7.25–7.43)
pO2, Ven: 46 mmHg — ABNORMAL HIGH (ref 32–45)

## 2023-11-13 LAB — CBG MONITORING, ED
Glucose-Capillary: 297 mg/dL — ABNORMAL HIGH (ref 70–99)
Glucose-Capillary: 206 mg/dL — ABNORMAL HIGH (ref 70–99)
Glucose-Capillary: 239 mg/dL — ABNORMAL HIGH (ref 70–99)

## 2023-11-13 LAB — LIPASE, BLOOD: Lipase: 28 U/L (ref 11–51)

## 2023-11-13 LAB — TROPONIN I (HIGH SENSITIVITY): Troponin I (High Sensitivity): 3 ng/L (ref <18)

## 2023-11-13 MED ORDER — ALUM & MAG HYDROXIDE-SIMETH 200-200-20 MG/5ML PO SUSP
15.0000 mL | Freq: Once | ORAL | Status: AC
  Administered 2023-11-13: 15 mL via ORAL
  Filled 2023-11-13: qty 30

## 2023-11-13 MED ORDER — ONDANSETRON 4 MG PO TBDP: 4.0000 mg | ORAL_TABLET | Freq: Four times a day (QID) | ORAL | 0 refills | Status: DC | PRN

## 2023-11-13 MED ORDER — SODIUM CHLORIDE 0.9 % IV BOLUS
1000.0000 mL | Freq: Once | INTRAVENOUS | Status: AC
  Administered 2023-11-13: 1000 mL via INTRAVENOUS

## 2023-11-13 MED ORDER — POTASSIUM CHLORIDE CRYS ER 20 MEQ PO TBCR
40.0000 meq | EXTENDED_RELEASE_TABLET | Freq: Once | ORAL | Status: AC
  Administered 2023-11-13: 40 meq via ORAL
  Filled 2023-11-13: qty 2

## 2023-11-13 MED ORDER — LIDOCAINE VISCOUS HCL 2 % MT SOLN
15.0000 mL | Freq: Once | OROMUCOSAL | Status: AC
  Administered 2023-11-13: 15 mL via OROMUCOSAL
  Filled 2023-11-13: qty 15

## 2023-11-13 MED ORDER — IOHEXOL 300 MG/ML  SOLN
100.0000 mL | Freq: Once | INTRAMUSCULAR | Status: AC | PRN
  Administered 2023-11-13: 100 mL via INTRAVENOUS

## 2023-11-13 MED ORDER — ONDANSETRON HCL 4 MG/2ML IJ SOLN
4.0000 mg | INTRAMUSCULAR | Status: AC
  Administered 2023-11-13: 4 mg via INTRAVENOUS
  Filled 2023-11-13: qty 2

## 2023-11-13 MED ORDER — FAMOTIDINE IN NACL 20-0.9 MG/50ML-% IV SOLN
20.0000 mg | Freq: Once | INTRAVENOUS | Status: AC
  Administered 2023-11-13: 20 mg via INTRAVENOUS
  Filled 2023-11-13: qty 50

## 2023-11-13 NOTE — ED Notes (Signed)
 Patient ambulated to the hallway bathroom with a steady gait. Patient did c/o increased chest pain with ambulation. Dr. Valetta Gaudy aware.

## 2023-11-13 NOTE — Discharge Instructions (Signed)
? ?  Please return to the emergency room right away if you are to develop a fever, severe nausea, your pain becomes severe or worsens, you are unable to keep food down, begin vomiting any dark or bloody fluid, you develop any dark or bloody stools, feel dehydrated, or other new concerns or symptoms arise. ? ?

## 2023-11-13 NOTE — ED Triage Notes (Signed)
 Pt via POV from home. Pt c/o NV and abd pain that started Tuesday. Reports drinking alcohol on Tuesday and pt has a hx of DM and pancreatitis. Pt is A&Ox4 and NAD.

## 2023-11-13 NOTE — ED Provider Notes (Signed)
 [facility] Provider Note    Event Date/Time   First MD Initiated Contact with Patient 11/13/23 1000     (approximate)   History   Emesis   HPI  Karen Johnson is a 23 y.o. female with a history of obesity and diabetes and prior pancreatitis   Patient reports that about 2 days ago she had drank several alcoholic drinks.  She started having nausea and vomiting and reports that she has not been able to stop vomiting since.  She reports a burning sensation in her chest and the back of her throat.  She has not been able to drink or eat much at all because of the ongoing vomiting.  She denies any abdominal pain no fevers.  She reports instead of burning sensation in mid chest and throat.  No diarrhea.  Denies pregnancy.  Feels dehydrated  Physical Exam   Triage Vital Signs: ED Triage Vitals  Encounter Vitals Group     BP 11/13/23 0951 124/80     Systolic BP Percentile --      Diastolic BP Percentile --      Pulse Rate 11/13/23 0951 98     Resp 11/13/23 0951 18     Temp 11/13/23 0951 98.2 F (36.8 C)     Temp Source 11/13/23 0951 Oral     SpO2 11/13/23 0951 100 %     Weight 11/13/23 0949 200 lb (90.7 kg)     Height 11/13/23 0949 5\' 9"  (1.753 m)     Head Circumference --      Peak Flow --      Pain Score 11/13/23 0949 10     Pain Loc --      Pain Education --      Exclude from Growth Chart --     Most recent vital signs: Vitals:   11/13/23 1400 11/13/23 1430  BP: 116/81 109/70  Pulse: 71 72  Resp:    Temp:    SpO2: 100% 100%     General: Awake, laying on her side, also noted on arrival that she was having frequent dry heaving. CV:  Good peripheral perfusion.  Normal tones Resp:  Normal effort.  Abd:  No distention.  Soft nontender nondistended throughout.  No associated abdominal pain.  Abdomen soft.  No pain in the epigastrium negative for right upper quadrant pain etc.  Negative Murphy Other:  Alert oriented   ED Results /  Procedures / Treatments   Labs (all labs ordered are listed, but only abnormal results are displayed) Labs Reviewed  CBC - Abnormal; Notable for the following components:      Result Value   WBC 17.7 (*)    All other components within normal limits  COMPREHENSIVE METABOLIC PANEL WITH GFR - Abnormal; Notable for the following components:   Potassium 3.3 (*)    Chloride 93 (*)    Glucose, Bld 274 (*)    Total Protein 8.4 (*)    Alkaline Phosphatase 31 (*)    All other components within normal limits  BLOOD GAS, VENOUS - Abnormal; Notable for the following components:   pH, Ven 7.5 (*)    pO2, Ven 46 (*)    Bicarbonate 35.9 (*)    Acid-Base Excess 11.2 (*)    All other components within normal limits  BETA-HYDROXYBUTYRIC ACID - Abnormal; Notable for the following components:   Beta-Hydroxybutyric Acid 1.05 (*)    All other components within normal limits  URINALYSIS, ROUTINE W  REFLEX MICROSCOPIC - Abnormal; Notable for the following components:   Color, Urine YELLOW (*)    APPearance CLEAR (*)    Glucose, UA >=500 (*)    Ketones, ur 80 (*)    Protein, ur 30 (*)    All other components within normal limits  CBG MONITORING, ED - Abnormal; Notable for the following components:   Glucose-Capillary 297 (*)    All other components within normal limits  CBG MONITORING, ED - Abnormal; Notable for the following components:   Glucose-Capillary 206 (*)    All other components within normal limits  CBG MONITORING, ED - Abnormal; Notable for the following components:   Glucose-Capillary 239 (*)    All other components within normal limits  LIPASE, BLOOD  POC URINE PREG, ED  CBG MONITORING, ED  CBG MONITORING, ED  CBG MONITORING, ED  CBG MONITORING, ED  CBG MONITORING, ED  CBG MONITORING, ED  TROPONIN I (HIGH SENSITIVITY)       RADIOLOGY  CT ABDOMEN PELVIS W CONTRAST Result Date: 11/13/2023 CLINICAL DATA:  Abdominal pain.  Concern for bowel obstruction. EXAM: CT ABDOMEN AND  PELVIS WITH CONTRAST TECHNIQUE: Multidetector CT imaging of the abdomen and pelvis was performed using the standard protocol following bolus administration of intravenous contrast. RADIATION DOSE REDUCTION: This exam was performed according to the departmental dose-optimization program which includes automated exposure control, adjustment of the mA and/or kV according to patient size and/or use of iterative reconstruction technique. CONTRAST:  OMNIPAQUE  IOHEXOL  300 MG/ML  SOLN COMPARISON:  CT abdomen pelvis dated 03/02/2022. FINDINGS: Lower chest: The visualized lung bases are clear. No intra-abdominal free air or free fluid. Hepatobiliary: Probable mild fatty liver. No biliary ductal dilatation. The gallbladder is unremarkable. Pancreas: Unremarkable. No pancreatic ductal dilatation or surrounding inflammatory changes. Spleen: Normal in size without focal abnormality. Adrenals/Urinary Tract: The adrenal glands unremarkable. The kidneys, visualized ureters, and urinary bladder appear unremarkable. Stomach/Bowel: There is no bowel obstruction or active inflammation. The appendix is normal. Vascular/Lymphatic: The abdominal aorta and IVC are unremarkable. No portal venous gas. There is no adenopathy. Reproductive: The uterus is anteverted. No suspicious adnexal masses. Other: None Musculoskeletal: No acute or significant osseous findings. IMPRESSION: 1. No acute intra-abdominal or pelvic pathology. No bowel obstruction. Normal appendix. 2. Probable mild fatty liver. Electronically Signed   By: Angus Bark M.D.   On: 11/13/2023 14:46   DG Chest Portable 1 View Result Date: 11/13/2023 CLINICAL DATA:  Chest pain. EXAM: PORTABLE CHEST 1 VIEW COMPARISON:  July 12, 2011. FINDINGS: The heart size and mediastinal contours are within normal limits. Both lungs are clear. The visualized skeletal structures are unremarkable. IMPRESSION: No active disease. Electronically Signed   By: Rosalene Colon M.D.   On:  11/13/2023 13:24      PROCEDURES:  Critical Care performed: No  Procedures   MEDICATIONS ORDERED IN ED: Medications  potassium chloride SA (KLOR-CON M) CR tablet 40 mEq (has no administration in time range)  sodium chloride 0.9 % bolus 1,000 mL (0 mLs Intravenous Stopped 11/13/23 1200)  ondansetron (ZOFRAN) injection 4 mg (4 mg Intravenous Given 11/13/23 1056)  famotidine  (PEPCID ) IVPB 20 mg premix (0 mg Intravenous Stopped 11/13/23 1128)  alum & mag hydroxide-simeth (MAALOX/MYLANTA) 200-200-20 MG/5ML suspension 15 mL (15 mLs Oral Given 11/13/23 1137)  lidocaine  (XYLOCAINE ) 2 % viscous mouth solution 15 mL (15 mLs Mouth/Throat Given 11/13/23 1137)  sodium chloride 0.9 % bolus 1,000 mL (0 mLs Intravenous Stopped 11/13/23 1450)  alum & mag  hydroxide-simeth (MAALOX/MYLANTA) 200-200-20 MG/5ML suspension 15 mL (15 mLs Oral Given 11/13/23 1332)  ondansetron (ZOFRAN) injection 4 mg (4 mg Intravenous Given 11/13/23 1329)  iohexol  (OMNIPAQUE ) 300 MG/ML solution 100 mL (100 mLs Intravenous Contrast Given 11/13/23 1302)     IMPRESSION / MDM / ASSESSMENT AND PLAN / ED COURSE  I reviewed the triage vital signs and the nursing notes.                              Differential diagnosis includes, but is not limited to, possible gastritis, reflux, esophagitis, no clinical findings that would be highly suggestive of concern for Boerhaave's or Mallory-Weiss tear, also considerations such as metabolic such as DKA are strongly considered as well in this type I diabetic.  No associate abdominal pain, which is reassuring.  The clinical history she provides seems to relate that alcoholic gastritis may be the initiating cause though other considerations are made.  Will hydrate, provide antiemetic, antihistamine, await labs and further testing thereafter.  Patient's presentation is most consistent with acute complicated illness / injury requiring diagnostic workup.      Clinical Course as of 11/13/23 1526  Thu  Nov 13, 2023  1145 No further emesis.  Patient is resting at this time, but continues to feel slightly nauseated.  Her white count is notably 17,000, raising concern for potential infection in the setting of her nausea and vomiting.  Burning in the chest is started to subside after antiemetic, Pepcid , oral solutions. [MQ]  1220 ED ECG REPORT I, Iver Marker, the attending physician, personally viewed and interpreted this ECG.   EKG Time: 1220 Rate: 60 Rhythm: normal sinus rhythm QRS Axis: normal Intervals: normal ST/T Wave abnormalities: normal Narrative Interpretation: no evidence of acute ischemia  [MQ]    Clinical Course User Index [MQ] Iver Marker, MD   ----------------------------------------- 3:24 PM on 11/13/2023 ----------------------------------------- Patient's glucose has improved at approximately 200.  She is awake alert well-oriented.  Symptoms have improved dramatically no further emesis, improvement in burning symptomatology.  She has notable leukocytosis but I suspect this may be more reactive or inflammatory in nature as there is no evidence of acute bacterial infection on her workup today.  She is now improved resting comfortably comfortable with the plan for discharge.  She will avoid alcohol use, plan over-the-counter Pepcid , and Zofran as needed.  Return precautions and treatment recommendations and follow-up discussed with the patient who is agreeable with the plan.    FINAL CLINICAL IMPRESSION(S) / ED DIAGNOSES   Final diagnoses:  Acute alcoholic gastritis without hemorrhage     Rx / DC Orders   ED Discharge Orders          Ordered    ondansetron (ZOFRAN-ODT) 4 MG disintegrating tablet  Every 6 hours PRN        11/13/23 1503             Note:  This document was prepared using Dragon voice recognition software and may include unintentional dictation errors.   Iver Marker, MD 11/13/23 [phone]

## 2023-11-13 NOTE — ED Notes (Signed)
 Patient taken to CT scan.

## 2023-11-14 DIAGNOSIS — R109 Unspecified abdominal pain: Secondary | ICD-10-CM | POA: Diagnosis not present

## 2023-11-14 DIAGNOSIS — R0602 Shortness of breath: Secondary | ICD-10-CM | POA: Diagnosis not present

## 2023-11-14 DIAGNOSIS — R079 Chest pain, unspecified: Secondary | ICD-10-CM | POA: Diagnosis not present

## 2023-11-14 DIAGNOSIS — Z5321 Procedure and treatment not carried out due to patient leaving prior to being seen by health care provider: Secondary | ICD-10-CM | POA: Diagnosis not present

## 2023-11-25 ENCOUNTER — Encounter (HOSPITAL_BASED_OUTPATIENT_CLINIC_OR_DEPARTMENT_OTHER): Payer: Self-pay | Admitting: Family Medicine

## 2023-11-25 ENCOUNTER — Other Ambulatory Visit (HOSPITAL_BASED_OUTPATIENT_CLINIC_OR_DEPARTMENT_OTHER): Payer: Self-pay

## 2023-11-25 ENCOUNTER — Ambulatory Visit (HOSPITAL_BASED_OUTPATIENT_CLINIC_OR_DEPARTMENT_OTHER): Admitting: Family Medicine

## 2023-11-25 VITALS — BP 123/79 | HR 104 | Ht 70.0 in | Wt 201.6 lb

## 2023-11-25 DIAGNOSIS — E1365 Other specified diabetes mellitus with hyperglycemia: Secondary | ICD-10-CM | POA: Diagnosis not present

## 2023-11-25 DIAGNOSIS — L732 Hidradenitis suppurativa: Secondary | ICD-10-CM | POA: Diagnosis not present

## 2023-11-25 LAB — POCT GLYCOSYLATED HEMOGLOBIN (HGB A1C)
HbA1c POC (<> result, manual entry): 7.7 % (ref 4.0–5.6)
Hemoglobin A1C: 7.7 % — AB (ref 4.0–5.6)

## 2023-11-25 MED ORDER — CLINDAMYCIN PHOS (TWICE-DAILY) 1 % EX GEL
Freq: Two times a day (BID) | CUTANEOUS | 0 refills | Status: AC | PRN
  Filled 2023-11-25: qty 30, 30d supply, fill #0

## 2023-11-25 MED ORDER — CHLORHEXIDINE GLUCONATE 4 % EX SOLN
CUTANEOUS | 0 refills | Status: AC
  Filled 2023-11-25: qty 1000, fill #0
  Filled 2024-01-21: qty 1062, fill #0
  Filled 2024-01-21: qty 946, 30d supply, fill #0
  Filled 2024-02-25: qty 946, fill #0

## 2023-11-25 MED ORDER — METFORMIN HCL 500 MG PO TABS
500.0000 mg | ORAL_TABLET | Freq: Two times a day (BID) | ORAL | 1 refills | Status: AC
  Filled 2023-11-25: qty 180, 90d supply, fill #0
  Filled 2024-02-25: qty 180, 90d supply, fill #1

## 2023-11-25 MED ORDER — INSULIN GLARGINE 100 UNIT/ML SOLOSTAR PEN
10.0000 [IU] | PEN_INJECTOR | Freq: Every day | SUBCUTANEOUS | 1 refills | Status: AC
  Filled 2023-11-25 – 2024-02-25 (×3): qty 9, 90d supply, fill #0

## 2023-11-25 MED ORDER — TRIAMCINOLONE ACETONIDE 0.1 % EX CREA
1.0000 | TOPICAL_CREAM | Freq: Two times a day (BID) | CUTANEOUS | 0 refills | Status: AC
  Filled 2023-11-25: qty 30, 30d supply, fill #0

## 2023-11-25 MED ORDER — VALACYCLOVIR HCL 500 MG PO TABS
500.0000 mg | ORAL_TABLET | Freq: Two times a day (BID) | ORAL | 2 refills | Status: DC
  Filled 2023-11-25: qty 6, 3d supply, fill #0

## 2023-11-25 NOTE — Progress Notes (Signed)
    Procedures performed today:    None.  Independent interpretation of notes and tests performed by another provider:   None.  Brief History, Exam, Impression, and Recommendations:    BP 123/79 (BP Location: Right Arm, Patient Position: Sitting, Cuff Size: Normal)   Pulse (!) 104   Ht 5\' 10"  (1.778 m)   Wt 201 lb 9.6 oz (91.4 kg)   LMP 10/27/2023 (Approximate)   SpO2 100%   BMI 28.93 kg/m   Patient overdue for follow-up.  Recently, she did have evaluation in the emergency department and was diagnosed with acute alcoholic gastritis.  She is presently doing well from ED evaluation.  Did review imaging results, lab results as well as documentation from recent ED visits.  Other specified diabetes mellitus with hyperglycemia, unspecified whether long term insulin  use (HCC) Assessment & Plan: Has not had recent A1c checked, most recent A1c was above goal at 8.8%.  She was prescribed metformin  as well as insulin  glargine, however had difficulty with obtaining long-acting insulin  due to insurance coverage, prior authorization.  She has been taking metformin , indicates that she is currently low on this. She was previously referred to endocrinology and did have appointment scheduled, however on review of chart, she was no-show for her appointment with endocrinology in January, has not had appointment rescheduled. She has questions today about CGM device, insulin  management. Given prior A1c, ambiguity of type I versus type 2 diabetes, I do feel that utilizing long-acting insulin  would be reasonable.  New prescription for insulin  glargine sent to pharmacy on file and we will work closely on prior authorization in order to receive approval for this Discussed importance of evaluation with endocrinology.  Patient will contact endocrinology office in order to reschedule missed appointment  Orders: -     Insulin  Glargine; Inject 10 Units into the skin daily.  Dispense: 15 mL; Refill: 1 -     POCT  glycosylated hemoglobin (Hb A1C)  Hidradenitis suppurativa Assessment & Plan: Had previous evaluation in our office related to this.  Was prescribed Hibiclens as well as Clindagel for management of symptoms and skin findings.  She is requesting refill of topical medications at this time.  Had to use these inconsistently when prescribed previously.  Instructed on proper use.  If continuing to have issues, likely proceed with referral to dermatology   Other orders -     metFORMIN  HCl; Take 1 tablet (500 mg total) by mouth 2 (two) times daily with a meal.  Dispense: 180 tablet; Refill: 1 -     valACYclovir  HCl; Take 1 tablet (500 mg total) by mouth 2 (two) times daily.  Dispense: 6 tablet; Refill: 2 -     Clindamycin  Phos (Twice-Daily); Apply topically 2 (two) times daily as needed.  Dispense: 30 g; Refill: 0 -     Chlorhexidine Gluconate; Use 1-2 times per month  Dispense: 1000 mL; Refill: 0 -     Triamcinolone  Acetonide; Apply 1 Application topically 2 (two) times daily.  Dispense: 30 g; Refill: 0  Return in about 3 weeks (around 12/16/2023) for diabetes, med check. Patient previously has had 3 no-shows with our office.  Spent 36 minutes on this patient encounter, including preparation, chart review, face-to-face counseling with patient and coordination of care, and documentation of encounter   ___________________________________________ Maram Bently de Peru, MD, ABFM, [facility] Primary Care and Sports Medicine [facility]

## 2023-11-25 NOTE — Assessment & Plan Note (Signed)
 Has not had recent A1c checked, most recent A1c was above goal at 8.8%.  She was prescribed metformin  as well as insulin  glargine, however had difficulty with obtaining long-acting insulin  due to insurance coverage, prior authorization.  She has been taking metformin , indicates that she is currently low on this. She was previously referred to endocrinology and did have appointment scheduled, however on review of chart, she was no-show for her appointment with endocrinology in January, has not had appointment rescheduled. She has questions today about CGM device, insulin  management. Given prior A1c, ambiguity of type I versus type 2 diabetes, I do feel that utilizing long-acting insulin  would be reasonable.  New prescription for insulin  glargine sent to pharmacy on file and we will work closely on prior authorization in order to receive approval for this Discussed importance of evaluation with endocrinology.  Patient will contact endocrinology office in order to reschedule missed appointment

## 2023-11-25 NOTE — Patient Instructions (Signed)
  Medication Instructions:  Your physician recommends that you continue on your current medications as directed. Please refer to the Current Medication list given to you today. --If you need a refill on any your medications before your next appointment, please call your pharmacy first. If no refills are authorized on file call the office.--   Referrals/Procedures/Imaging: Endocrinology   Follow-Up: Your next appointment:   Your physician recommends that you schedule a follow-up appointment in: 3-4 week follow up  with Dr. de Peru  You will receive a text message or e-mail with a link to a survey about your care and experience with us  today! We would greatly appreciate your feedback!   Thanks for letting us  be apart of your health journey!!  Primary Care and Sports Medicine   Dr. Court Distance Peru   We encourage you to activate your patient portal called "MyChart".  Sign up information is provided on this After Visit Summary.  MyChart is used to connect with patients for Virtual Visits (Telemedicine).  Patients are able to view lab/test results, encounter notes, upcoming appointments, etc.  Non-urgent messages can be sent to your provider as well. To learn more about what you can do with MyChart, please visit --  [email or web address].

## 2023-11-25 NOTE — Assessment & Plan Note (Signed)
 Had previous evaluation in our office related to this.  Was prescribed Hibiclens as well as Clindagel for management of symptoms and skin findings.  She is requesting refill of topical medications at this time.  Had to use these inconsistently when prescribed previously.  Instructed on proper use.  If continuing to have issues, likely proceed with referral to dermatology

## 2023-12-16 ENCOUNTER — Ambulatory Visit (HOSPITAL_BASED_OUTPATIENT_CLINIC_OR_DEPARTMENT_OTHER): Admitting: Family Medicine

## 2023-12-18 ENCOUNTER — Other Ambulatory Visit (HOSPITAL_BASED_OUTPATIENT_CLINIC_OR_DEPARTMENT_OTHER): Payer: Self-pay | Admitting: Family Medicine

## 2024-01-21 ENCOUNTER — Other Ambulatory Visit: Payer: Self-pay

## 2024-01-21 ENCOUNTER — Encounter (HOSPITAL_BASED_OUTPATIENT_CLINIC_OR_DEPARTMENT_OTHER): Payer: Self-pay | Admitting: Family Medicine

## 2024-01-21 ENCOUNTER — Other Ambulatory Visit (HOSPITAL_BASED_OUTPATIENT_CLINIC_OR_DEPARTMENT_OTHER): Payer: Self-pay

## 2024-01-21 NOTE — Telephone Encounter (Signed)
 Please see mychart message sent by pt and advise.

## 2024-01-22 ENCOUNTER — Other Ambulatory Visit: Payer: Self-pay

## 2024-01-22 ENCOUNTER — Other Ambulatory Visit (HOSPITAL_BASED_OUTPATIENT_CLINIC_OR_DEPARTMENT_OTHER): Payer: Self-pay

## 2024-01-22 ENCOUNTER — Encounter (HOSPITAL_BASED_OUTPATIENT_CLINIC_OR_DEPARTMENT_OTHER): Payer: Self-pay | Admitting: *Deleted

## 2024-01-22 ENCOUNTER — Telehealth (HOSPITAL_BASED_OUTPATIENT_CLINIC_OR_DEPARTMENT_OTHER): Payer: Self-pay | Admitting: *Deleted

## 2024-01-22 MED ORDER — VALACYCLOVIR HCL 500 MG PO TABS: 500.0000 mg | ORAL_TABLET | Freq: Two times a day (BID) | ORAL | 3 refills | Status: AC

## 2024-01-22 NOTE — Telephone Encounter (Signed)
 LVM for patient to call the office patient was dismissed from the practice due to no shows. Patient will need to find a new primary care provider. 30 days of medication were sent in to hold until she is able to find a new provider.

## 2024-01-22 NOTE — Telephone Encounter (Signed)
 Copied from CRM [id number]. Topic: Appointments - Scheduling Inquiry for Clinic >> Jan 22, 2024  8:32 AM Emylou G wrote: Reason for CRM: Pls call patient ( her appt was cancelled/dismissed? ) but refills were done per mychart.  Can she be seen for checkup?

## 2024-02-02 ENCOUNTER — Other Ambulatory Visit (HOSPITAL_BASED_OUTPATIENT_CLINIC_OR_DEPARTMENT_OTHER): Payer: Self-pay

## 2024-02-25 ENCOUNTER — Other Ambulatory Visit: Payer: Self-pay

## 2024-02-25 ENCOUNTER — Other Ambulatory Visit (HOSPITAL_BASED_OUTPATIENT_CLINIC_OR_DEPARTMENT_OTHER): Payer: Self-pay

## 2024-02-26 ENCOUNTER — Other Ambulatory Visit (HOSPITAL_BASED_OUTPATIENT_CLINIC_OR_DEPARTMENT_OTHER): Payer: Self-pay

## 2024-02-27 ENCOUNTER — Other Ambulatory Visit: Payer: Self-pay

## 2024-02-27 ENCOUNTER — Other Ambulatory Visit (HOSPITAL_COMMUNITY): Payer: Self-pay

## 2024-02-27 ENCOUNTER — Other Ambulatory Visit (HOSPITAL_BASED_OUTPATIENT_CLINIC_OR_DEPARTMENT_OTHER): Payer: Self-pay

## 2024-03-08 ENCOUNTER — Other Ambulatory Visit (HOSPITAL_BASED_OUTPATIENT_CLINIC_OR_DEPARTMENT_OTHER): Payer: Self-pay | Admitting: Family Medicine

## 2024-03-08 ENCOUNTER — Other Ambulatory Visit (HOSPITAL_BASED_OUTPATIENT_CLINIC_OR_DEPARTMENT_OTHER): Payer: Self-pay

## 2024-03-08 DIAGNOSIS — E1365 Other specified diabetes mellitus with hyperglycemia: Secondary | ICD-10-CM

## 2024-03-08 NOTE — Telephone Encounter (Signed)
 Copied from CRM [id number]. Topic: Clinical - Medication Refill >> Mar 08, 2024 11:25 AM Emylou G wrote: Medication: Glucose Blood (BLOOD GLUCOSE TEST STRIPS) STRP valACYclovir  (VALTREX ) 500 MG tablet   Has the patient contacted their pharmacy? Yes (Agent: If no, request that the patient contact the pharmacy for the refill. If patient does not wish to contact the pharmacy document the reason why and proceed with request.) (Agent: If yes, when and what did the pharmacy advise?) said to call us   This is the patient's preferred pharmacy:  [facility] [address] [place] - [address] [place] [address] Phone: [phone] Fax: [phone]  Is this the correct pharmacy for this prescription? Yes If no, delete pharmacy and type the correct one.   Has the prescription been filled recently? No  Is the patient out of the medication? Yes  Has the patient been seen for an appointment in the last year OR does the patient have an upcoming appointment? Yes  Can we respond through MyChart? Yes  Agent: Please be advised that Rx refills may take up to 3 business days. We ask that you follow-up with your pharmacy.

## 2024-03-09 ENCOUNTER — Ambulatory Visit (HOSPITAL_BASED_OUTPATIENT_CLINIC_OR_DEPARTMENT_OTHER): Admitting: Family Medicine

## 2024-03-12 ENCOUNTER — Other Ambulatory Visit: Payer: Self-pay

## 2024-03-12 ENCOUNTER — Emergency Department
Admission: EM | Admit: 2024-03-12 | Discharge: 2024-03-13 | Disposition: A | Discharge status: Home / Self Care | Source: Ambulatory Visit | Attending: Emergency Medicine | Admitting: Emergency Medicine

## 2024-03-12 DIAGNOSIS — D72829 Elevated white blood cell count, unspecified: Secondary | ICD-10-CM | POA: Insufficient documentation

## 2024-03-12 DIAGNOSIS — K2901 Acute gastritis with bleeding: Secondary | ICD-10-CM | POA: Insufficient documentation

## 2024-03-12 DIAGNOSIS — R739 Hyperglycemia, unspecified: Secondary | ICD-10-CM | POA: Diagnosis not present

## 2024-03-12 DIAGNOSIS — R197 Diarrhea, unspecified: Secondary | ICD-10-CM | POA: Diagnosis present

## 2024-03-12 DIAGNOSIS — R112 Nausea with vomiting, unspecified: Secondary | ICD-10-CM | POA: Diagnosis not present

## 2024-03-12 DIAGNOSIS — R1084 Generalized abdominal pain: Secondary | ICD-10-CM | POA: Diagnosis not present

## 2024-03-12 LAB — CBC
HCT: 37.2 % (ref 36.0–46.0)
Hemoglobin: 13.3 g/dL (ref 12.0–15.0)
MCH: 29.6 pg (ref 26.0–34.0)
MCHC: 35.8 g/dL (ref 30.0–36.0)
MCV: 82.9 fL (ref 80.0–100.0)
Platelets: 318 K/uL (ref 150–400)
RBC: 4.49 MIL/uL (ref 3.87–5.11)
RDW: 11.7 % (ref 11.5–15.5)
WBC: 17 K/uL — ABNORMAL HIGH (ref 4.0–10.5)
nRBC: 0 % (ref 0.0–0.2)

## 2024-03-12 LAB — COMPREHENSIVE METABOLIC PANEL WITH GFR
ALT: 11 U/L (ref 0–44)
AST: 16 U/L (ref 15–41)
Albumin: 4.1 g/dL (ref 3.5–5.0)
Alkaline Phosphatase: 27 U/L — ABNORMAL LOW (ref 38–126)
Anion gap: 10 (ref 5–15)
BUN: 8 mg/dL (ref 6–20)
CO2: 25 mmol/L (ref 22–32)
Calcium: 9.4 mg/dL (ref 8.9–10.3)
Chloride: 102 mmol/L (ref 98–111)
Creatinine, Ser: 0.77 mg/dL (ref 0.44–1.00)
GFR, Estimated: >60 mL/min (ref >60)
Glucose, Bld: 203 mg/dL — ABNORMAL HIGH (ref 70–99)
Potassium: 4.1 mmol/L (ref 3.5–5.1)
Sodium: 137 mmol/L (ref 135–145)
Total Bilirubin: 0.7 mg/dL (ref 0.0–1.2)
Total Protein: 7.5 g/dL (ref 6.5–8.1)

## 2024-03-12 LAB — LIPASE, BLOOD: Lipase: 27 U/L (ref 11–51)

## 2024-03-12 MED ORDER — LIDOCAINE VISCOUS HCL 2 % MT SOLN
15.0000 mL | Freq: Once | OROMUCOSAL | Status: AC
  Administered 2024-03-12: 15 mL via ORAL
  Filled 2024-03-12: qty 15

## 2024-03-12 MED ORDER — PANTOPRAZOLE SODIUM 40 MG IV SOLR
40.0000 mg | Freq: Once | INTRAVENOUS | Status: AC
Start: 2024-03-12 — End: 2024-03-12
  Administered 2024-03-12: 40 mg via INTRAVENOUS
  Filled 2024-03-12: qty 10

## 2024-03-12 MED ORDER — ALUM & MAG HYDROXIDE-SIMETH 200-200-20 MG/5ML PO SUSP
30.0000 mL | Freq: Once | ORAL | Status: AC
  Administered 2024-03-12: 30 mL via ORAL
  Filled 2024-03-12: qty 30

## 2024-03-12 MED ORDER — FAMOTIDINE IN NACL 20-0.9 MG/50ML-% IV SOLN
20.0000 mg | Freq: Once | INTRAVENOUS | Status: AC
  Administered 2024-03-12: 20 mg via INTRAVENOUS
  Filled 2024-03-12: qty 50

## 2024-03-12 MED ORDER — SODIUM CHLORIDE 0.9 % IV BOLUS
500.0000 mL | Freq: Once | INTRAVENOUS | Status: AC
  Administered 2024-03-12: 500 mL via INTRAVENOUS

## 2024-03-12 MED ORDER — DROPERIDOL 2.5 MG/ML IJ SOLN
1.2500 mg | Freq: Once | INTRAMUSCULAR | Status: AC
  Administered 2024-03-12: 1.25 mg via INTRAVENOUS
  Filled 2024-03-12: qty 2

## 2024-03-12 NOTE — ED Triage Notes (Signed)
 First nurse note: pt to ED [facility] for abd pain, n/v x2 days. 75mcg fentanyl PTA, 4mg  zofran , phenergan IM. Hx pancreatitis. Cbg 202 20g R AC

## 2024-03-12 NOTE — ED Triage Notes (Signed)
 Pt comes via EMS from [facility] with c/o belly pain for 2 days. Pt states N/V.

## 2024-03-12 NOTE — ED Provider Notes (Signed)
 [facility] Provider Note    Event Date/Time   First MD Initiated Contact with Patient 03/12/24 2256     (approximate)   History   Abdominal Pain   HPI Karen Johnson is a 23 y.o. female with a history of recurrent gastritis, typically alcohol induced, and a prior history of multiple episodes of pancreatitis and possibly cyclic vomiting syndrome.  She presents tonight for evaluation of 1 to 2 days of persistent nausea and vomiting.  She has also had diarrhea.  She said the symptoms started a little over 24 hours ago after eating dinner.  She said this time is different because she has not been drinking any alcohol, but the symptoms feel the same with a burning middle and upper abdominal pain and the nausea.  She has had multiple loose bowel movements.  The vomiting got worse tonight after she tried to eat some more and she started to see some blood in the vomit.  [facility] and they gave her some IV nausea medicine and then called EMS to bring her over here.  She received IV fentanyl and IV Zofran  by EMS and she said that made it feel better, but then she waited here for 5 hours and now she started to feel worse again.  She denies fever, chest pain, shortness of breath.  She feels better now than she did earlier.  No dysuria.     Physical Exam   Triage Vital Signs: ED Triage Vitals  Encounter Vitals Group     BP 03/12/24 1818 (!) 143/88     Girls Systolic BP Percentile --      Girls Diastolic BP Percentile --      Boys Systolic BP Percentile --      Boys Diastolic BP Percentile --      Pulse Rate 03/12/24 1818 64     Resp 03/12/24 1818 18     Temp 03/12/24 1818 99.1 F (37.3 C)     Temp src --      SpO2 03/12/24 1818 100 %     Weight 03/12/24 1818 90.7 kg (200 lb)     Height 03/12/24 1818 1.753 m (5' 9)     Head Circumference --      Peak Flow --      Pain Score 03/12/24 1815 10     Pain Loc --      Pain Education --      Exclude from  Growth Chart --     Most recent vital signs: Vitals:   03/12/24 2200 03/12/24 2300  BP: 132/76 119/74  Pulse: 70 73  Resp: 16   Temp: 98.7 F (37.1 C)   SpO2: 97% 99%    General: Awake, appears comfortable although she reports symptoms are starting to come back. CV:  Good peripheral perfusion.  Regular rate and rhythm.  Normal heart sounds. Resp:  Normal effort. Speaking easily and comfortably, no accessory muscle usage nor intercostal retractions.  Lungs are clear to auscultation. Abd:  No distention.  Soft.  Patient has no rebound or guarding.  She has no reaction to palpation to suggest tenderness but she reports the epigastrium is the area where it was hurting earlier.   ED Results / Procedures / Treatments   Labs (all labs ordered are listed, but only abnormal results are displayed) Labs Reviewed  COMPREHENSIVE METABOLIC PANEL WITH GFR - Abnormal; Notable for the following components:      Result Value  Glucose, Bld 203 (*)    Alkaline Phosphatase 27 (*)    All other components within normal limits  CBC - Abnormal; Notable for the following components:   WBC 17.0 (*)    All other components within normal limits  LIPASE, BLOOD  URINALYSIS, ROUTINE W REFLEX MICROSCOPIC  POC URINE PREG, ED     EKG  I reviewed prior EKG from 4 months ago that revealed a QTc interval of about 440 ms.     PROCEDURES:  Critical Care performed: No  Procedures    IMPRESSION / MDM / ASSESSMENT AND PLAN / ED COURSE  I reviewed the triage vital signs and the nursing notes.                              Differential diagnosis includes, but is not limited to, gastritis, cyclic vomiting syndrome, cannabinoid hyperemesis syndrome, foodborne pathogen or viral illness, much less likely SBO/ileus.  Patient's presentation is most consistent with acute presentation with potential threat to life or bodily function.  Labs/studies ordered: CMP, lipase, CBC, urinalysis,  POC  Interventions/Medications given:  Medications  pantoprazole  (PROTONIX ) injection 40 mg (40 mg Intravenous Given 03/12/24 2327)  sodium chloride  0.9 % bolus 500 mL (500 mLs Intravenous New Bag/Given 03/12/24 2326)  famotidine  (PEPCID ) IVPB 20 mg premix (20 mg Intravenous New Bag/Given 03/12/24 2326)  alum & mag hydroxide-simeth (MAALOX/MYLANTA) 200-200-20 MG/5ML suspension 30 mL (30 mLs Oral Given 03/12/24 2327)    And  lidocaine  (XYLOCAINE ) 2 % viscous mouth solution 15 mL (15 mLs Oral Given 03/12/24 2327)  droperidol  (INAPSINE ) 2.5 MG/ML injection 1.25 mg (1.25 mg Intravenous Given 03/12/24 2326)    (Note:  hospital course my include additional interventions and/or labs/studies not listed above.)   Vital signs are normal and physical exam is reassuring.  Presentation is similar to prior although the patient reports she has not been drinking.  However the presentation is very consistent with gastritis with probable minor Mallory-Weiss tear.  Her symptoms seem relatively well-controlled but after reviewing the medical record I see that she has done well in the past with treatment for gastritis.  I will do so again with medications as listed above in addition to a small dose of droperidol  1.25 mg IV which I think will help with the persistent nausea.  Small fluid bolus is pending.  At this point I do not think she would benefit from any imaging.  Leukocytosis is steady from prior and is consistent when she has bouts of gastritis, not necessarily indicative of an acute infectious process.  Anticipate discharge with outpatient follow-up assuming symptoms continue to improve    Clinical Course as of 03/13/24 0037  Sat Mar 13, 2024  0032 Patient is resting.  I suggested we get together her paperwork says she can go home and get some good rest and she nodded.  Her partner is with her and he agreed that sounded good to them.  Symptoms have improved, vital signs stable, appropriate for discharge and  outpatient follow-up.  I gave my usual and customary follow-up recommendations and return precautions. [CF]    Clinical Course User Index [CF] Gordan Huxley, MD     FINAL CLINICAL IMPRESSION(S) / ED DIAGNOSES   Final diagnoses:  Acute gastritis with hemorrhage, unspecified gastritis type     Rx / DC Orders   ED Discharge Orders          Ordered    sucralfate  (  CARAFATE ) 1 g tablet  4 times daily PRN        03/13/24 0036    ondansetron  (ZOFRAN -ODT) 4 MG disintegrating tablet        03/13/24 0036    omeprazole  (PRILOSEC  OTC) 20 MG tablet  Daily        03/13/24 0036    Ambulatory Referral to Primary Care (Establish Care)        03/13/24 0037             Note:  This document was prepared using Dragon voice recognition software and may include unintentional dictation errors.   Gordan Huxley, MD 03/13/24 [phone]

## 2024-03-13 MED ORDER — OMEPRAZOLE MAGNESIUM 20 MG PO TBEC: 20.0000 mg | DELAYED_RELEASE_TABLET | Freq: Every day | ORAL | 1 refills | Status: AC

## 2024-03-13 MED ORDER — SUCRALFATE 1 G PO TABS: 1.0000 g | ORAL_TABLET | Freq: Four times a day (QID) | ORAL | 1 refills | Status: AC | PRN

## 2024-03-13 MED ORDER — ONDANSETRON 4 MG PO TBDP: ORAL_TABLET | ORAL | 0 refills | Status: AC

## 2024-03-13 NOTE — Discharge Instructions (Signed)
 Please take the prescribed medications and stick with a bland diet for the next few days.  Drink plenty of fluids such as Gatorade or something similar to stay hydrated and keep your electrolytes replenished.  Follow-up with your regular doctor at the next available opportunity.  We put in a referral return to the emergency department if you develop new or worsening symptoms that concern you.

## 2024-03-13 NOTE — ED Notes (Signed)
 Pt given DC instructions. Pt verbalized understanding of medications and follow up care. Pt taken from ED in wheelchair. Pt in NAD at time of departure.

## 2024-03-23 ENCOUNTER — Ambulatory Visit: Payer: Self-pay

## 2024-03-23 ENCOUNTER — Other Ambulatory Visit (HOSPITAL_BASED_OUTPATIENT_CLINIC_OR_DEPARTMENT_OTHER): Payer: Self-pay

## 2024-03-23 NOTE — Telephone Encounter (Signed)
 Patient was dismissed from [facility], PAS spoke with [facility] who advised patient would not be accepted back to [facility]. Caller disconnected prior to warm transfer. Attempt to reach patient is unsuccessful.     Copied from CRM #[id number]. Topic: Clinical - Red Word Triage >> Mar 23, 2024  2:38 PM DeAngela L wrote: Red Word that prompted transfer to Nurse Triage: light headed passed out the other day and she has to stop and take a break at work drowsiness light headed and have to sit down, high blood sugar possible high and she was out of test supplies a week   Pt num [phone] (M)

## 2024-05-19 ENCOUNTER — Emergency Department
Admission: EM | Admit: 2024-05-19 | Discharge: 2024-05-19 | Disposition: A | Discharge status: Home / Self Care | Attending: Emergency Medicine | Admitting: Emergency Medicine

## 2024-05-19 ENCOUNTER — Other Ambulatory Visit: Payer: Self-pay

## 2024-05-19 DIAGNOSIS — E1165 Type 2 diabetes mellitus with hyperglycemia: Secondary | ICD-10-CM | POA: Insufficient documentation

## 2024-05-19 DIAGNOSIS — D72829 Elevated white blood cell count, unspecified: Secondary | ICD-10-CM | POA: Diagnosis not present

## 2024-05-19 DIAGNOSIS — R112 Nausea with vomiting, unspecified: Secondary | ICD-10-CM | POA: Diagnosis present

## 2024-05-19 LAB — CBC
HCT: 37.9 % (ref 36.0–46.0)
Hemoglobin: 13.6 g/dL (ref 12.0–15.0)
MCH: 30.1 pg (ref 26.0–34.0)
MCHC: 35.9 g/dL (ref 30.0–36.0)
MCV: 83.8 fL (ref 80.0–100.0)
Platelets: 303 K/uL (ref 150–400)
RBC: 4.52 MIL/uL (ref 3.87–5.11)
RDW: 12.5 % (ref 11.5–15.5)
WBC: 13 K/uL — ABNORMAL HIGH (ref 4.0–10.5)
nRBC: 0 % (ref 0.0–0.2)

## 2024-05-19 LAB — CBG MONITORING, ED: Glucose-Capillary: 159 mg/dL — ABNORMAL HIGH (ref 70–99)

## 2024-05-19 LAB — COMPREHENSIVE METABOLIC PANEL WITH GFR
ALT: 9 U/L (ref 0–44)
AST: 11 U/L — ABNORMAL LOW (ref 15–41)
Albumin: 4.8 g/dL (ref 3.5–5.0)
Alkaline Phosphatase: 34 U/L — ABNORMAL LOW (ref 38–126)
Anion gap: 14 (ref 5–15)
BUN: 12 mg/dL (ref 6–20)
CO2: 27 mmol/L (ref 22–32)
Calcium: 9.8 mg/dL (ref 8.9–10.3)
Chloride: 99 mmol/L (ref 98–111)
Creatinine, Ser: 0.69 mg/dL (ref 0.44–1.00)
GFR, Estimated: >60 mL/min (ref >60)
Glucose, Bld: 159 mg/dL — ABNORMAL HIGH (ref 70–99)
Potassium: 3.7 mmol/L (ref 3.5–5.1)
Sodium: 140 mmol/L (ref 135–145)
Total Bilirubin: 0.6 mg/dL (ref 0.0–1.2)
Total Protein: 7.9 g/dL (ref 6.5–8.1)

## 2024-05-19 LAB — LIPASE, BLOOD: Lipase: 22 U/L (ref 11–51)

## 2024-05-19 LAB — URINALYSIS, ROUTINE W REFLEX MICROSCOPIC
Bacteria, UA: NONE SEEN
Bilirubin Urine: NEGATIVE
Glucose, UA: NEGATIVE mg/dL
Hgb urine dipstick: NEGATIVE
Ketones, ur: 80 mg/dL — AB
Leukocytes,Ua: NEGATIVE
Nitrite: NEGATIVE
Protein, ur: 100 mg/dL — AB
Specific Gravity, Urine: 1.039 — ABNORMAL HIGH (ref 1.005–1.030)
pH: 5 (ref 5.0–8.0)

## 2024-05-19 LAB — POC URINE PREG, ED: Preg Test, Ur: NEGATIVE

## 2024-05-19 LAB — BETA-HYDROXYBUTYRIC ACID: Beta-Hydroxybutyric Acid: 0.94 mmol/L — ABNORMAL HIGH (ref 0.05–0.27)

## 2024-05-19 MED ORDER — ALUM & MAG HYDROXIDE-SIMETH 200-200-20 MG/5ML PO SUSP
30.0000 mL | Freq: Once | ORAL | Status: AC
  Administered 2024-05-19: 30 mL via ORAL
  Filled 2024-05-19: qty 30

## 2024-05-19 MED ORDER — OMEPRAZOLE MAGNESIUM 20 MG PO TBEC
20.0000 mg | DELAYED_RELEASE_TABLET | Freq: Every day | ORAL | 0 refills | Status: AC
Start: 2024-05-19 — End: 2024-06-02

## 2024-05-19 MED ORDER — SUCRALFATE 1 G PO TABS: 1.0000 g | ORAL_TABLET | Freq: Three times a day (TID) | ORAL | 0 refills | Status: AC | PRN

## 2024-05-19 MED ORDER — SODIUM CHLORIDE 0.9 % IV BOLUS
1000.0000 mL | Freq: Once | INTRAVENOUS | Status: AC
  Administered 2024-05-19: 1000 mL via INTRAVENOUS

## 2024-05-19 MED ORDER — ONDANSETRON HCL 4 MG PO TABS: 4.0000 mg | ORAL_TABLET | Freq: Four times a day (QID) | ORAL | 0 refills | Status: AC | PRN

## 2024-05-19 MED ORDER — ONDANSETRON 4 MG PO TBDP
4.0000 mg | ORAL_TABLET | Freq: Once | ORAL | Status: AC | PRN
  Administered 2024-05-19: 4 mg via ORAL
  Filled 2024-05-19: qty 1

## 2024-05-19 MED ORDER — DIPHENHYDRAMINE HCL 50 MG/ML IJ SOLN
25.0000 mg | Freq: Once | INTRAMUSCULAR | Status: AC
  Administered 2024-05-19: 25 mg via INTRAVENOUS
  Filled 2024-05-19: qty 1

## 2024-05-19 MED ORDER — LIDOCAINE VISCOUS HCL 2 % MT SOLN
15.0000 mL | Freq: Once | OROMUCOSAL | Status: AC
  Administered 2024-05-19: 15 mL via OROMUCOSAL
  Filled 2024-05-19: qty 15

## 2024-05-19 MED ORDER — PANTOPRAZOLE SODIUM 40 MG IV SOLR
40.0000 mg | Freq: Once | INTRAVENOUS | Status: AC
  Administered 2024-05-19: 40 mg via INTRAVENOUS
  Filled 2024-05-19: qty 10

## 2024-05-19 MED ORDER — DROPERIDOL 2.5 MG/ML IJ SOLN
2.5000 mg | Freq: Once | INTRAMUSCULAR | Status: AC
  Administered 2024-05-19: 2.5 mg via INTRAVENOUS
  Filled 2024-05-19: qty 2

## 2024-05-19 NOTE — Discharge Instructions (Addendum)
 Your testing today was fortunately reassuring. I have sent a prescription for nausea medication to your pharmacy that you can take as needed.  I have also sent a prescription for medications to help with stomach acid.  Follow-up with your PCP for further evaluation. Return to the ER for new or worsening symptoms.

## 2024-05-19 NOTE — ED Notes (Signed)
 Patient states she would like to leave. Provider made aware and is at the bedside at this time

## 2024-05-19 NOTE — ED Triage Notes (Signed)
 Pt reports NV and abdominal pain since Monday. Is a T2DM - has been out of her diabetic medication for a while. Hx of  noncompliance.

## 2024-05-19 NOTE — ED Provider Notes (Signed)
 [facility] Provider Note    Event Date/Time   First MD Initiated Contact with Patient 05/19/24 1958     (approximate)   History   Abdominal Pain   HPI  Karen Johnson is a 23 year old female with history of recurrent gastritis, and marijuana use presenting to the emergency department for evaluation of vomiting.  Patient reports that a few days ago she had onset of nausea with multiple episodes of vomiting.  Reports abdominal pain previously when vomiting, denies any active pain.  Says this is similar to multiple episodes she has had in the past, unsure of cause.  Does report that she has not been compliant with her diabetes medications recently.    Physical Exam   Triage Vital Signs: ED Triage Vitals  Encounter Vitals Group     BP 05/19/24 1943 101/86     Girls Systolic BP Percentile --      Girls Diastolic BP Percentile --      Boys Systolic BP Percentile --      Boys Diastolic BP Percentile --      Pulse Rate 05/19/24 1943 78     Resp 05/19/24 1943 16     Temp 05/19/24 1943 97.7 F (36.5 C)     Temp Source 05/19/24 1943 Oral     SpO2 05/19/24 1943 99 %     Weight 05/19/24 1944 185 lb (83.9 kg)     Height 05/19/24 1944 5' 9 (1.753 m)     Head Circumference --      Peak Flow --      Pain Score 05/19/24 1953 10     Pain Loc --      Pain Education --      Exclude from Growth Chart --     Most recent vital signs: Vitals:   05/19/24 2256 05/19/24 2300  BP: 118/80 119/78  Pulse: 69 71  Resp: 18   Temp: 98 F (36.7 C)   SpO2: 100% 100%     General: Awake, interactive  CV:  Good peripheral perfusion Resp:  Unlabored respirations Abd:  Nondistended, soft, no significant tenderness to palpation Neuro:  Symmetric facial movement, fluid speech   ED Results / Procedures / Treatments   Labs (all labs ordered are listed, but only abnormal results are displayed) Labs Reviewed  COMPREHENSIVE METABOLIC PANEL WITH GFR - Abnormal; Notable  for the following components:      Result Value   Glucose, Bld 159 (*)    AST 11 (*)    Alkaline Phosphatase 34 (*)    All other components within normal limits  CBC - Abnormal; Notable for the following components:   WBC 13.0 (*)    All other components within normal limits  URINALYSIS, ROUTINE W REFLEX MICROSCOPIC - Abnormal; Notable for the following components:   Color, Urine AMBER (*)    APPearance HAZY (*)    Specific Gravity, Urine 1.039 (*)    Ketones, ur 80 (*)    Protein, ur 100 (*)    All other components within normal limits  BETA-HYDROXYBUTYRIC ACID - Abnormal; Notable for the following components:   Beta-Hydroxybutyric Acid 0.94 (*)    All other components within normal limits  CBG MONITORING, ED - Abnormal; Notable for the following components:   Glucose-Capillary 159 (*)    All other components within normal limits  LIPASE, BLOOD  POC URINE PREG, ED     EKG EKG independently reviewed and interpreted by myself demonstrates:  RADIOLOGY Imaging independently reviewed and interpreted by myself demonstrates:   Formal Radiology Read:  No results found.  PROCEDURES:  Critical Care performed: No  Procedures   MEDICATIONS ORDERED IN ED: Medications  ondansetron  (ZOFRAN -ODT) disintegrating tablet 4 mg (4 mg Oral Given 05/19/24 2006)  sodium chloride  0.9 % bolus 1,000 mL (0 mLs Intravenous Stopped 05/19/24 2254)  droperidol  (INAPSINE ) 2.5 MG/ML injection 2.5 mg (2.5 mg Intravenous Given 05/19/24 2041)  pantoprazole  (PROTONIX ) injection 40 mg (40 mg Intravenous Given 05/19/24 2041)  alum & mag hydroxide-simeth (MAALOX/MYLANTA) 200-200-20 MG/5ML suspension 30 mL (30 mLs Oral Given 05/19/24 2046)  lidocaine  (XYLOCAINE ) 2 % viscous mouth solution 15 mL (15 mLs Mouth/Throat Given 05/19/24 2046)  diphenhydrAMINE  (BENADRYL ) injection 25 mg (25 mg Intravenous Given 05/19/24 2120)     IMPRESSION / MDM / ASSESSMENT AND PLAN / ED COURSE  I reviewed the triage  vital signs and the nursing notes.  Differential diagnosis includes, but is not limited to, gastritis, cannabinoid hyperemesis, pancreatitis, very low suspicion other acute intra-abdominal process given overall reassuring abdominal exam, consideration for DKA, hyperglycemia, electrolyte abnormality  Patient's presentation is most consistent with acute presentation with potential threat to life or bodily function.  23 year old female presenting with vomiting and abdominal pain.  Reassuring abdominal exam.  Stable vitals on presentation.  Labs with mild leukocytosis, CMP with mild hyperglycemia but normal bicarb and anion gap, not consistent with DKA.  Normal lipase.  UA without evidence of infection, ketones noted likely related to dehydration.  Do note the patient has been seen multiple times for prior.  Previous success with fluids, Protonix , GI cocktail, droperidol .  Ordered for this here.  On reassessment, patient did report restlessness which she was concerned was related to the medications she received.  Do suspect possible akathisia related to her other droperidol .  Was given Benadryl  with improvement.  Patient did not have further vomiting here.  She is comfortable with discharge home.  Strict return precautions provided.  Patient discharged condition.      FINAL CLINICAL IMPRESSION(S) / ED DIAGNOSES   Final diagnoses:  Nausea and vomiting, unspecified vomiting type     Rx / DC Orders   ED Discharge Orders          Ordered    ondansetron  (ZOFRAN ) 4 MG tablet  Every 6 hours PRN        05/19/24 2331    omeprazole  (PRILOSEC  OTC) 20 MG tablet  Daily        05/19/24 2331    sucralfate  (CARAFATE ) 1 g tablet  Every 8 hours PRN        05/19/24 2331             Note:  This document was prepared using Dragon voice recognition software and may include unintentional dictation errors.   Levander Slate, MD 05/19/24 [phone]
